# Patient Record
Sex: Female | Born: 1970 | Race: White | Hispanic: No | Marital: Married | Smoking: Former smoker
Health system: Southern US, Community
[De-identification: ages and names within clinical notes are randomized; demographics above are authoritative.]

## PROBLEM LIST (undated history)

## (undated) DIAGNOSIS — I10 Essential (primary) hypertension: Secondary | ICD-10-CM

## (undated) DIAGNOSIS — I880 Nonspecific mesenteric lymphadenitis: Secondary | ICD-10-CM

## (undated) DIAGNOSIS — E1143 Type 2 diabetes mellitus with diabetic autonomic (poly)neuropathy: Secondary | ICD-10-CM

## (undated) DIAGNOSIS — D649 Anemia, unspecified: Secondary | ICD-10-CM

## (undated) DIAGNOSIS — E785 Hyperlipidemia, unspecified: Secondary | ICD-10-CM

## (undated) DIAGNOSIS — R000 Tachycardia, unspecified: Secondary | ICD-10-CM

## (undated) DIAGNOSIS — C539 Malignant neoplasm of cervix uteri, unspecified: Secondary | ICD-10-CM

## (undated) DIAGNOSIS — R079 Chest pain, unspecified: Secondary | ICD-10-CM

## (undated) DIAGNOSIS — D509 Iron deficiency anemia, unspecified: Secondary | ICD-10-CM

## (undated) DIAGNOSIS — K3184 Gastroparesis: Secondary | ICD-10-CM

## (undated) DIAGNOSIS — F419 Anxiety disorder, unspecified: Secondary | ICD-10-CM

## (undated) DIAGNOSIS — E119 Type 2 diabetes mellitus without complications: Secondary | ICD-10-CM

## (undated) HISTORY — DX: Iron deficiency anemia, unspecified: D50.9

## (undated) HISTORY — PX: HERNIA REPAIR: SHX51

## (undated) HISTORY — DX: Tachycardia, unspecified: R00.0

## (undated) HISTORY — DX: Hyperlipidemia, unspecified: E78.5

## (undated) HISTORY — DX: Anxiety disorder, unspecified: F41.9

## (undated) HISTORY — PX: PARTIAL HYSTERECTOMY: SHX80

## (undated) HISTORY — PX: TUBAL LIGATION: SHX77

## (undated) HISTORY — DX: Essential (primary) hypertension: I10

## (undated) HISTORY — DX: Type 2 diabetes mellitus without complications: E11.9

## (undated) HISTORY — DX: Morbid (severe) obesity due to excess calories: E66.01

## (undated) HISTORY — DX: Chest pain, unspecified: R07.9

---

## 2011-12-07 DIAGNOSIS — R079 Chest pain, unspecified: Secondary | ICD-10-CM

## 2012-01-05 DIAGNOSIS — R079 Chest pain, unspecified: Secondary | ICD-10-CM

## 2012-01-06 DIAGNOSIS — R000 Tachycardia, unspecified: Secondary | ICD-10-CM

## 2012-01-06 DIAGNOSIS — R079 Chest pain, unspecified: Secondary | ICD-10-CM

## 2012-01-07 DIAGNOSIS — R072 Precordial pain: Secondary | ICD-10-CM

## 2012-01-08 DIAGNOSIS — R079 Chest pain, unspecified: Secondary | ICD-10-CM

## 2012-05-12 ENCOUNTER — Encounter: Payer: Self-pay | Admitting: Physician Assistant

## 2012-05-13 ENCOUNTER — Encounter: Payer: Self-pay | Admitting: Physician Assistant

## 2012-05-13 DIAGNOSIS — R079 Chest pain, unspecified: Secondary | ICD-10-CM

## 2012-05-13 LAB — PULMONARY FUNCTION TEST

## 2012-05-14 ENCOUNTER — Encounter: Payer: Self-pay | Admitting: Cardiology

## 2012-05-28 ENCOUNTER — Encounter: Payer: BC Managed Care – PPO | Admitting: Physician Assistant

## 2012-06-10 ENCOUNTER — Encounter: Payer: BC Managed Care – PPO | Admitting: Physician Assistant

## 2012-11-07 DIAGNOSIS — R079 Chest pain, unspecified: Secondary | ICD-10-CM

## 2014-07-11 ENCOUNTER — Encounter (HOSPITAL_COMMUNITY): Payer: Self-pay | Admitting: Emergency Medicine

## 2014-07-11 ENCOUNTER — Emergency Department (HOSPITAL_COMMUNITY): Payer: BLUE CROSS/BLUE SHIELD

## 2014-07-11 ENCOUNTER — Inpatient Hospital Stay (HOSPITAL_COMMUNITY)
Admission: EM | Admit: 2014-07-11 | Discharge: 2014-07-14 | DRG: 392 | Disposition: A | Discharge status: Home / Self Care | Payer: BLUE CROSS/BLUE SHIELD | Attending: Internal Medicine | Admitting: Internal Medicine

## 2014-07-11 DIAGNOSIS — K219 Gastro-esophageal reflux disease without esophagitis: Secondary | ICD-10-CM | POA: Diagnosis present

## 2014-07-11 DIAGNOSIS — K529 Noninfective gastroenteritis and colitis, unspecified: Secondary | ICD-10-CM | POA: Diagnosis not present

## 2014-07-11 DIAGNOSIS — D649 Anemia, unspecified: Secondary | ICD-10-CM | POA: Diagnosis present

## 2014-07-11 DIAGNOSIS — K3184 Gastroparesis: Secondary | ICD-10-CM | POA: Diagnosis present

## 2014-07-11 DIAGNOSIS — D72829 Elevated white blood cell count, unspecified: Secondary | ICD-10-CM

## 2014-07-11 DIAGNOSIS — R079 Chest pain, unspecified: Secondary | ICD-10-CM

## 2014-07-11 DIAGNOSIS — R000 Tachycardia, unspecified: Secondary | ICD-10-CM | POA: Diagnosis present

## 2014-07-11 DIAGNOSIS — R112 Nausea with vomiting, unspecified: Secondary | ICD-10-CM | POA: Diagnosis not present

## 2014-07-11 DIAGNOSIS — E118 Type 2 diabetes mellitus with unspecified complications: Secondary | ICD-10-CM

## 2014-07-11 DIAGNOSIS — K589 Irritable bowel syndrome without diarrhea: Secondary | ICD-10-CM | POA: Diagnosis present

## 2014-07-11 DIAGNOSIS — E785 Hyperlipidemia, unspecified: Secondary | ICD-10-CM | POA: Diagnosis present

## 2014-07-11 DIAGNOSIS — K210 Gastro-esophageal reflux disease with esophagitis: Secondary | ICD-10-CM

## 2014-07-11 DIAGNOSIS — Z8541 Personal history of malignant neoplasm of cervix uteri: Secondary | ICD-10-CM

## 2014-07-11 DIAGNOSIS — R109 Unspecified abdominal pain: Secondary | ICD-10-CM | POA: Diagnosis present

## 2014-07-11 DIAGNOSIS — I10 Essential (primary) hypertension: Secondary | ICD-10-CM | POA: Diagnosis present

## 2014-07-11 DIAGNOSIS — I471 Supraventricular tachycardia: Secondary | ICD-10-CM

## 2014-07-11 DIAGNOSIS — Z794 Long term (current) use of insulin: Secondary | ICD-10-CM

## 2014-07-11 DIAGNOSIS — R197 Diarrhea, unspecified: Secondary | ICD-10-CM | POA: Diagnosis present

## 2014-07-11 DIAGNOSIS — E1143 Type 2 diabetes mellitus with diabetic autonomic (poly)neuropathy: Secondary | ICD-10-CM | POA: Diagnosis present

## 2014-07-11 DIAGNOSIS — R1110 Vomiting, unspecified: Secondary | ICD-10-CM

## 2014-07-11 DIAGNOSIS — Z87891 Personal history of nicotine dependence: Secondary | ICD-10-CM

## 2014-07-11 DIAGNOSIS — E861 Hypovolemia: Secondary | ICD-10-CM | POA: Diagnosis present

## 2014-07-11 DIAGNOSIS — D259 Leiomyoma of uterus, unspecified: Secondary | ICD-10-CM | POA: Diagnosis present

## 2014-07-11 DIAGNOSIS — I252 Old myocardial infarction: Secondary | ICD-10-CM

## 2014-07-11 DIAGNOSIS — I880 Nonspecific mesenteric lymphadenitis: Secondary | ICD-10-CM | POA: Diagnosis present

## 2014-07-11 DIAGNOSIS — F419 Anxiety disorder, unspecified: Secondary | ICD-10-CM | POA: Diagnosis present

## 2014-07-11 HISTORY — DX: Malignant neoplasm of cervix uteri, unspecified: C53.9

## 2014-07-11 HISTORY — DX: Nonspecific mesenteric lymphadenitis: I88.0

## 2014-07-11 HISTORY — DX: Anemia, unspecified: D64.9

## 2014-07-11 HISTORY — DX: Gastroparesis: K31.84

## 2014-07-11 HISTORY — DX: Type 2 diabetes mellitus with diabetic autonomic (poly)neuropathy: E11.43

## 2014-07-11 LAB — LIPASE, BLOOD: Lipase: 19 U/L (ref 11–59)

## 2014-07-11 LAB — CBC WITH DIFFERENTIAL/PLATELET
Basophils Absolute: 0 10*3/uL (ref 0.0–0.1)
Basophils Relative: 0 % (ref 0–1)
Eosinophils Absolute: 0.4 10*3/uL (ref 0.0–0.7)
Eosinophils Relative: 3 % (ref 0–5)
HCT: 37.5 % (ref 36.0–46.0)
Hemoglobin: 11.6 g/dL — ABNORMAL LOW (ref 12.0–15.0)
LYMPHS ABS: 2.7 10*3/uL (ref 0.7–4.0)
LYMPHS PCT: 19 % (ref 12–46)
MCH: 25.3 pg — AB (ref 26.0–34.0)
MCHC: 30.9 g/dL (ref 30.0–36.0)
MCV: 81.7 fL (ref 78.0–100.0)
MONOS PCT: 4 % (ref 3–12)
Monocytes Absolute: 0.6 10*3/uL (ref 0.1–1.0)
NEUTROS PCT: 74 % (ref 43–77)
Neutro Abs: 10.4 10*3/uL — ABNORMAL HIGH (ref 1.7–7.7)
PLATELETS: 479 10*3/uL — AB (ref 150–400)
RBC: 4.59 MIL/uL (ref 3.87–5.11)
RDW: 14.1 % (ref 11.5–15.5)
WBC: 14.2 10*3/uL — ABNORMAL HIGH (ref 4.0–10.5)

## 2014-07-11 LAB — BASIC METABOLIC PANEL
ANION GAP: 9 (ref 5–15)
BUN: 14 mg/dL (ref 6–23)
CALCIUM: 9.7 mg/dL (ref 8.4–10.5)
CO2: 25 mmol/L (ref 19–32)
Chloride: 102 mmol/L (ref 96–112)
Creatinine, Ser: 0.78 mg/dL (ref 0.50–1.10)
GFR calc Af Amer: >90 mL/min (ref >90)
GFR calc non Af Amer: >90 mL/min (ref >90)
Glucose, Bld: 256 mg/dL — ABNORMAL HIGH (ref 70–99)
Potassium: 4 mmol/L (ref 3.5–5.1)
SODIUM: 136 mmol/L (ref 135–145)

## 2014-07-11 LAB — TROPONIN I: Troponin I: <0.03 ng/mL (ref <0.031)

## 2014-07-11 LAB — HEPATIC FUNCTION PANEL
ALT: 20 U/L (ref 0–35)
AST: 19 U/L (ref 0–37)
Albumin: 4.3 g/dL (ref 3.5–5.2)
Alkaline Phosphatase: 106 U/L (ref 39–117)
BILIRUBIN TOTAL: 0.7 mg/dL (ref 0.3–1.2)
Total Protein: 8.4 g/dL — ABNORMAL HIGH (ref 6.0–8.3)

## 2014-07-11 MED ORDER — METOPROLOL TARTRATE 1 MG/ML IV SOLN
2.5000 mg | Freq: Once | INTRAVENOUS | Status: AC
  Administered 2014-07-11: 2.5 mg via INTRAVENOUS
  Filled 2014-07-11: qty 5

## 2014-07-11 MED ORDER — PROMETHAZINE HCL 25 MG/ML IJ SOLN: 25.0000 mg | Freq: Four times a day (QID) | INTRAMUSCULAR | Status: DC | PRN

## 2014-07-11 MED ORDER — IOHEXOL 350 MG/ML SOLN
100.0000 mL | Freq: Once | INTRAVENOUS | Status: AC | PRN
  Administered 2014-07-11: 100 mL via INTRAVENOUS

## 2014-07-11 MED ORDER — SODIUM CHLORIDE 0.9 % IV BOLUS (SEPSIS): 1000.0000 mL | Freq: Once | INTRAVENOUS | Status: AC

## 2014-07-11 MED ORDER — ENOXAPARIN SODIUM 40 MG/0.4ML ~~LOC~~ SOLN
40.0000 mg | SUBCUTANEOUS | Status: DC
  Administered 2014-07-11: 40 mg via SUBCUTANEOUS
  Filled 2014-07-11: qty 0.4

## 2014-07-11 MED ORDER — MORPHINE SULFATE 4 MG/ML IJ SOLN
4.0000 mg | INTRAMUSCULAR | Status: DC | PRN
  Administered 2014-07-11 – 2014-07-14 (×15): 4 mg via INTRAVENOUS
  Filled 2014-07-11 (×15): qty 1

## 2014-07-11 MED ORDER — ONDANSETRON HCL 4 MG PO TABS: 4.0000 mg | ORAL_TABLET | Freq: Four times a day (QID) | ORAL | Status: DC | PRN

## 2014-07-11 MED ORDER — HYDRALAZINE HCL 20 MG/ML IJ SOLN: 5.0000 mg | INTRAMUSCULAR | Status: DC | PRN

## 2014-07-11 MED ORDER — ONDANSETRON HCL 4 MG/2ML IJ SOLN
4.0000 mg | Freq: Once | INTRAMUSCULAR | Status: AC
  Administered 2014-07-11: 4 mg via INTRAMUSCULAR

## 2014-07-11 MED ORDER — SODIUM CHLORIDE 0.9 % IV SOLN
INTRAVENOUS | Status: DC
  Administered 2014-07-11: 21:00:00 via INTRAVENOUS

## 2014-07-11 MED ORDER — METOPROLOL TARTRATE 50 MG PO TABS
100.0000 mg | ORAL_TABLET | Freq: Two times a day (BID) | ORAL | Status: DC
  Administered 2014-07-11 – 2014-07-14 (×6): 100 mg via ORAL
  Filled 2014-07-11 (×6): qty 2

## 2014-07-11 MED ORDER — ONDANSETRON HCL 4 MG/2ML IJ SOLN
INTRAMUSCULAR | Status: AC
  Filled 2014-07-11: qty 2

## 2014-07-11 MED ORDER — METOCLOPRAMIDE HCL 10 MG PO TABS
10.0000 mg | ORAL_TABLET | Freq: Four times a day (QID) | ORAL | Status: DC
  Administered 2014-07-11 – 2014-07-12 (×2): 10 mg via ORAL
  Filled 2014-07-11 (×2): qty 1

## 2014-07-11 MED ORDER — MORPHINE SULFATE 4 MG/ML IJ SOLN
4.0000 mg | INTRAMUSCULAR | Status: AC | PRN
  Administered 2014-07-11 (×2): 4 mg via INTRAVENOUS
  Filled 2014-07-11 (×2): qty 1

## 2014-07-11 MED ORDER — AMLODIPINE BESYLATE 5 MG PO TABS
10.0000 mg | ORAL_TABLET | Freq: Every day | ORAL | Status: DC
  Administered 2014-07-12 – 2014-07-14 (×3): 10 mg via ORAL
  Filled 2014-07-11 (×3): qty 2

## 2014-07-11 MED ORDER — SODIUM CHLORIDE 0.9 % IV BOLUS (SEPSIS)
500.0000 mL | Freq: Once | INTRAVENOUS | Status: AC
  Administered 2014-07-11: 500 mL via INTRAVENOUS

## 2014-07-11 MED ORDER — FENTANYL CITRATE 0.05 MG/ML IJ SOLN
INTRAMUSCULAR | Status: AC
  Filled 2014-07-11: qty 2

## 2014-07-11 MED ORDER — CILIDINIUM-CHLORDIAZEPOXIDE 2.5-5 MG PO CAPS
1.0000 | ORAL_CAPSULE | Freq: Three times a day (TID) | ORAL | Status: DC
Start: 2014-07-12 — End: 2014-07-12

## 2014-07-11 MED ORDER — GI COCKTAIL ~~LOC~~
30.0000 mL | Freq: Once | ORAL | Status: AC
  Administered 2014-07-11: 30 mL via ORAL
  Filled 2014-07-11: qty 30

## 2014-07-11 MED ORDER — FAMOTIDINE IN NACL 20-0.9 MG/50ML-% IV SOLN
20.0000 mg | Freq: Once | INTRAVENOUS | Status: AC
  Administered 2014-07-11: 20 mg via INTRAVENOUS
  Filled 2014-07-11: qty 50

## 2014-07-11 MED ORDER — ONDANSETRON HCL 4 MG/2ML IJ SOLN
4.0000 mg | Freq: Four times a day (QID) | INTRAMUSCULAR | Status: DC | PRN
  Administered 2014-07-12: 4 mg via INTRAVENOUS
  Filled 2014-07-11: qty 2

## 2014-07-11 MED ORDER — SODIUM CHLORIDE 0.9 % IJ SOLN
3.0000 mL | Freq: Two times a day (BID) | INTRAMUSCULAR | Status: DC
  Administered 2014-07-11 – 2014-07-13 (×4): 3 mL via INTRAVENOUS

## 2014-07-11 MED ORDER — SODIUM CHLORIDE 0.9 % IV SOLN
INTRAVENOUS | Status: DC
  Administered 2014-07-11: via INTRAVENOUS

## 2014-07-11 MED ORDER — PROMETHAZINE HCL 25 MG/ML IJ SOLN
12.5000 mg | Freq: Once | INTRAMUSCULAR | Status: AC
  Administered 2014-07-11: 12.5 mg via INTRAVENOUS
  Filled 2014-07-11: qty 1

## 2014-07-11 MED ORDER — FENTANYL CITRATE 0.05 MG/ML IJ SOLN
50.0000 ug | Freq: Once | INTRAMUSCULAR | Status: AC
  Administered 2014-07-11: 50 ug via INTRAVENOUS

## 2014-07-11 MED ORDER — INSULIN ASPART 100 UNIT/ML ~~LOC~~ SOLN
0.0000 [IU] | Freq: Three times a day (TID) | SUBCUTANEOUS | Status: DC
  Administered 2014-07-12: 3 [IU] via SUBCUTANEOUS
  Administered 2014-07-12: 5 [IU] via SUBCUTANEOUS
  Administered 2014-07-12: 2 [IU] via SUBCUTANEOUS
  Administered 2014-07-13: 3 [IU] via SUBCUTANEOUS
  Administered 2014-07-13: 2 [IU] via SUBCUTANEOUS

## 2014-07-11 MED ORDER — GI COCKTAIL ~~LOC~~: 30.0000 mL | Freq: Three times a day (TID) | ORAL | Status: DC | PRN

## 2014-07-11 NOTE — ED Notes (Signed)
Patient c/o upper abd pain that radiates into chest and left shoulder. Per patient abd pain started Sunday and pain radiating into chest started yesterday. Patient reports nausea, vomiting, and diarrhea. Denies any fevers or urinary symptoms.

## 2014-07-11 NOTE — H&P (Signed)
[facility] History and Physical  Monica Fernandez YTK:[id number] DOB: 1970-05-05 DOA: 07/11/2014  Referring physician: Dr Thurnell Garbe - [facility] PCP: Sudie Grumbling, DO   Chief Complaint: Nausea and vomiting  HPI: Monica Fernandez is a 44 y.o. female  Butch Penny pain associated with nausea and vomiting started 3 days ago. 4-5x emesis per day. Emesis is non-bloody. Unable to keep anything down. Associated with watery nonbloody diarrhea, upper epigastric abdominal pain with radiation to the upper chest and shoulder. Pain was initially intermittent but is now becoming constant. Pain is described as crampy and achy in nature. Patient has not been able to take her home prescriptions since Sunday. No alleviating factors. Getting worse.   Denies sick contacts  Review of Systems:  Constitutional:  No weight loss, night sweats, Fevers, chills, fatigue.  HEENT:  No headaches, Difficulty swallowing,Tooth/dental problems,Sore throat,  No sneezing, itching, ear ache, nasal congestion, post nasal drip,  Cardio-vascular: Per HPI GI: Per HPI Resp:   No shortness of breath with exertion or at rest. No excess mucus, no productive cough, No non-productive cough, No coughing up of blood.No change in color of mucus.No wheezing.No chest wall deformity  Skin:  no rash or lesions.  GU:  no dysuria, change in color of urine, no urgency or frequency. No flank pain.  Musculoskeletal:   No joint pain or swelling. No decreased range of motion. No back pain.  Psych:  No change in mood or affect. No depression or anxiety. No memory loss.   Past Medical History  Diagnosis Date  . Chest pain syndrome   . Sinus tachycardia     Persistent  . Microcytic anemia   . DM (diabetes mellitus)   . HTN (hypertension)   . HLD (hyperlipidemia)   . Morbid obesity   . Anxiety   . Cervical cancer    Past Surgical History  Procedure Laterality Date  . Hernia repair    . Partial hysterectomy    . Tubal ligation     Social  History:  reports that she has quit smoking. Her smoking use included Cigarettes. She has a .5 pack-year smoking history. She has never used smokeless tobacco. She reports that she drinks alcohol. She reports that she does not use illicit drugs.  No Known Allergies  Family History  Problem Relation Age of Onset  . Heart attack Father   . Heart attack Brother 36     Prior to Admission medications   Medication Sig Start Date End Date Taking? Authorizing Provider  amLODipine (NORVASC) 10 MG tablet Take 10 mg by mouth daily.    Historical Provider, MD  clidinium-chlordiazePOXIDE (LIBRAX) 2.5-5 MG per capsule Take 1 capsule by mouth 4 (four) times daily - after meals and at bedtime.    Historical Provider, MD  metoCLOPramide (REGLAN) 10 MG tablet Take 10 mg by mouth 4 (four) times daily.    Historical Provider, MD  metoprolol (LOPRESSOR) 100 MG tablet Take 100 mg by mouth 2 (two) times daily.    Historical Provider, MD  pantoprazole (PROTONIX) 40 MG tablet Take 40 mg by mouth daily.    Historical Provider, MD   Physical Exam: Filed Vitals:   07/11/14 1930 07/11/14 2030 07/11/14 2100 07/11/14 2130  BP: 136/103 112/92 131/92 142/81  Pulse: 120 101  109  Temp:      TempSrc:      Resp: 17 20  15   Height:      Weight:      SpO2: 98% 95%  97%  Wt Readings from Last 3 Encounters:  07/11/14 141.522 kg (312 lb)    General:  Appears calm and comfortable Eyes:  PERRL, normal lids, irises & conjunctiva ENT: Dry mucous membranes Neck:  no LAD, masses or thyromegaly Cardiovascular:  RRR, no m/r/g. Trace to 1+ lower extremity edema bilaterally Telemetry:  SR, no arrhythmias  Respiratory:  CTA bilaterally, no w/r/r. Normal respiratory effort. Abdomen: Morbidly obese, diffuse mild tenderness to palpation intermittently. Nondistended. Normal active bowel sounds Skin:  no rash or induration seen on limited exam Musculoskeletal:  grossly normal tone BUE/BLE Psychiatric:  grossly normal mood and  affect, speech fluent and appropriate Neurologic:  grossly non-focal.          Labs on Admission:  Basic Metabolic Panel:  Recent Labs Lab 07/11/14 1845  NA 136  K 4.0  CL 102  CO2 25  GLUCOSE 256*  BUN 14  CREATININE 0.78  CALCIUM 9.7   Liver Function Tests:  Recent Labs Lab 07/11/14 1850  AST 19  ALT 20  ALKPHOS 106  BILITOT 0.7  PROT 8.4*  ALBUMIN 4.3    Recent Labs Lab 07/11/14 1850  LIPASE 19   No results for input(s): AMMONIA in the last 168 hours. CBC:  Recent Labs Lab 07/11/14 1845  WBC 14.2*  NEUTROABS 10.4*  HGB 11.6*  HCT 37.5  MCV 81.7  PLT 479*   Cardiac Enzymes:  Recent Labs Lab 07/11/14 1850  TROPONINI <0.03    BNP (last 3 results) No results for input(s): BNP in the last 8760 hours.  ProBNP (last 3 results) No results for input(s): PROBNP in the last 8760 hours.  CBG: No results for input(s): GLUCAP in the last 168 hours.  Radiological Exams on Admission: Ct Angio Chest Pe W/cm &/or Wo Cm  07/11/2014   CLINICAL DATA:  Upper abdominal pain radiating in chest and left shoulder. Abdominal pain started 2 days prior, chest radiation 1 day prior. Nausea, vomiting, and diarrhea.  EXAM: CT ANGIOGRAPHY CHEST  CT ABDOMEN AND PELVIS WITH CONTRAST  TECHNIQUE: Multidetector CT imaging of the chest was performed using the standard protocol during bolus administration of intravenous contrast. Multiplanar CT image reconstructions and MIPs were obtained to evaluate the vascular anatomy. Multidetector CT imaging of the abdomen and pelvis was performed using the standard protocol during bolus administration of intravenous contrast.  CONTRAST:  169mL OMNIPAQUE IOHEXOL 350 MG/ML SOLN  COMPARISON:  Radiographs earlier this date. CT abdomen/ pelvis 02/21/2014  FINDINGS: CTA CHEST FINDINGS  There are no filling defects within the pulmonary arteries to suggest pulmonary embolus.  The thoracic aorta is normal in caliber without dissection. Heart at the  upper limits of normal in size. No pleural or pericardial effusion. No mediastinal or hilar adenopathy. Mild hypoventilatory changes at the lung bases, the lungs are otherwise clear. No pulmonary nodule or mass. No consolidation.  There are no acute or suspicious osseous abnormalities.  CT ABDOMEN and PELVIS FINDINGS  Liver is enlarged measuring 22.4 cm in cranial caudal dimension with mild hepatic steatosis. Gallbladder is physiologically distended. No biliary dilatation. Spleen is at the upper limits of normal in size measuring 12 cm. The pancreas and adrenal glands are normal. Kidneys demonstrate symmetric enhancement and excretion. No hydronephrosis. There is an 11 mm hypodensity in the upper left kidney.  Small hiatal hernia. Stomach is decompressed. There are no dilated or thickened bowel loops. The appendix is normal. Small volume of colonic stool. Borderline prominent mesenteric lymph nodes in the ileocolic chain. No  free air, free fluid, or intra-abdominal fluid collection.  Abdominal aorta is normal in caliber. No retroperitoneal adenopathy. Small fat containing umbilical hernia.  Within the pelvis the urinary bladder is minimally distended. Uterus is surgically absent. Both ovaries are tentatively identified and normal, no adnexal mass. There is no pelvic free fluid. No pelvic adenopathy.  There are no acute or suspicious osseous abnormalities. Small bone island in the right iliac bone, unchanged. Mild degenerative change in the lumbar spine.  Review of the MIP images confirms the above findings.  IMPRESSION: 1. No pulmonary embolus or acute intrathoracic process. 2. Borderline prominent mesenteric lymph nodes in the ileocolic chain, may reflect mild mesenteric adenitis. 3. There is otherwise no acute abnormality in the abdomen/pelvis. Small left renal hypodensity, likely cyst, and fat containing periumbilical hernia, unchanged.   Electronically Signed   By: Jeb Levering M.D.   On: 07/11/2014 20:55    Ct Abdomen Pelvis W Contrast  07/11/2014   CLINICAL DATA:  Upper abdominal pain radiating in chest and left shoulder. Abdominal pain started 2 days prior, chest radiation 1 day prior. Nausea, vomiting, and diarrhea.  EXAM: CT ANGIOGRAPHY CHEST  CT ABDOMEN AND PELVIS WITH CONTRAST  TECHNIQUE: Multidetector CT imaging of the chest was performed using the standard protocol during bolus administration of intravenous contrast. Multiplanar CT image reconstructions and MIPs were obtained to evaluate the vascular anatomy. Multidetector CT imaging of the abdomen and pelvis was performed using the standard protocol during bolus administration of intravenous contrast.  CONTRAST:  150mL OMNIPAQUE IOHEXOL 350 MG/ML SOLN  COMPARISON:  Radiographs earlier this date. CT abdomen/ pelvis 02/21/2014  FINDINGS: CTA CHEST FINDINGS  There are no filling defects within the pulmonary arteries to suggest pulmonary embolus.  The thoracic aorta is normal in caliber without dissection. Heart at the upper limits of normal in size. No pleural or pericardial effusion. No mediastinal or hilar adenopathy. Mild hypoventilatory changes at the lung bases, the lungs are otherwise clear. No pulmonary nodule or mass. No consolidation.  There are no acute or suspicious osseous abnormalities.  CT ABDOMEN and PELVIS FINDINGS  Liver is enlarged measuring 22.4 cm in cranial caudal dimension with mild hepatic steatosis. Gallbladder is physiologically distended. No biliary dilatation. Spleen is at the upper limits of normal in size measuring 12 cm. The pancreas and adrenal glands are normal. Kidneys demonstrate symmetric enhancement and excretion. No hydronephrosis. There is an 11 mm hypodensity in the upper left kidney.  Small hiatal hernia. Stomach is decompressed. There are no dilated or thickened bowel loops. The appendix is normal. Small volume of colonic stool. Borderline prominent mesenteric lymph nodes in the ileocolic chain. No free air, free  fluid, or intra-abdominal fluid collection.  Abdominal aorta is normal in caliber. No retroperitoneal adenopathy. Small fat containing umbilical hernia.  Within the pelvis the urinary bladder is minimally distended. Uterus is surgically absent. Both ovaries are tentatively identified and normal, no adnexal mass. There is no pelvic free fluid. No pelvic adenopathy.  There are no acute or suspicious osseous abnormalities. Small bone island in the right iliac bone, unchanged. Mild degenerative change in the lumbar spine.  Review of the MIP images confirms the above findings.  IMPRESSION: 1. No pulmonary embolus or acute intrathoracic process. 2. Borderline prominent mesenteric lymph nodes in the ileocolic chain, may reflect mild mesenteric adenitis. 3. There is otherwise no acute abnormality in the abdomen/pelvis. Small left renal hypodensity, likely cyst, and fat containing periumbilical hernia, unchanged.   Electronically Signed  By: Jeb Levering M.D.   On: 07/11/2014 20:55   Dg Abd Acute W/chest  07/11/2014   CLINICAL DATA:  Abdominal pain, nausea, vomiting, diarrhea  EXAM: ACUTE ABDOMEN SERIES (ABDOMEN 2 VIEW & CHEST 1 VIEW)  COMPARISON:  None.  FINDINGS: There is no evidence of dilated bowel loops or free intraperitoneal air. No radiopaque calculi or other significant radiographic abnormality is seen. Heart size and mediastinal contours are within normal limits. Both lungs are clear. Moderate stool noted in right colon.  IMPRESSION: Normal small bowel gas pattern. Moderate stool noted in right colon. No acute cardiopulmonary disease.   Electronically Signed   By: Lahoma Crocker M.D.   On: 07/11/2014 19:48    EKG: Independently reviewed. SInus Tach, no sign of ACS  Assessment/Plan Principal Problem:   Intractable nausea and vomiting Active Problems:   Sinus tachycardia   Chest pain   IBS (irritable bowel syndrome)   GERD (gastroesophageal reflux disease)   Leukocytosis   Essential hypertension    Diabetes mellitus with complication   Intractable nausea and vomiting: Likely secondary to diabetic Esther paresis with possible overlying viral gastroenteritis. Abdominal CT without acute process. Pepcid, fentanyl, GI cocktail, morphine, Zofran 4 mg, Phenergan 12.5 mg, 500 mL normal saline bolus with some improvement. Unable to keep down her Reglan for several days. Lipase normal. - zofran, PHenergan, Reglan - Normal saline 125 mL per hour - Normal saline 1 L bolus - Clear liquid diet, advance as tolerated  Chest pain: Likely secondary to GI etiology. Troponin negative, EKG without evidence of ACS. CT angiogram without evidence of PE, or other acute respiratory process - Cycle troponins - Treatment as above - EKG in a.m. - Telemetry  HTN: Hypertensive on presentation. In pain. Unable to tolerate medicines for several days. - Continue home Norvasc, metoprolol - Hydralazine when necessary SBP> 180  Leukocytosis: WBC 14.2. Likely secondary to several day history of acute distress and hemoconcentration - Follow-up CBC in a.m. - UA  SInus Tach: Patient should a slightly tachycardic. Unable to take metoprolol for several days. Currently in pain. EKG reassuring. - Resume beta blocker  GERD: - Continue Protonix  (Change to IV)  Diabetes: On Januvia only - SSI - A1c  IBS: - Continue Librax  Code Status: FULL DVT Prophylaxis: Lovenox Family Communication: None Disposition Plan: pending improvement  MERRELL, DAVID Lenna Sciara, MD Family Medicine [facility] [email or web address] Password [email or web address]

## 2014-07-11 NOTE — ED Provider Notes (Addendum)
CSN: [id number]     Arrival date & time 07/11/14  1754 History   First MD Initiated Contact with Patient 07/11/14 1846     Chief Complaint  Patient presents with  . Abdominal Pain  . Chest Pain  . Emesis  . Diarrhea      HPI Pt was seen at 1900. Per pt, c/o gradual onset and persistence of multiple intermittent episodes of N/V/D that began 3 days ago.   Describes the stools as "watery" and "dark." Has been associated with upper abd "pain" which radiates into her chest and shoulder that began yesterday and remains constant. Describes the pain as "cramping" and "aching." Pt states she has been unable to take any of her usual home medications due to her symptoms. Denies SOB, no back pain, no fevers, no blood in stools or emesis.     Past Medical History  Diagnosis Date  . Chest pain syndrome   . Sinus tachycardia     Persistent  . Microcytic anemia   . DM (diabetes mellitus)   . HTN (hypertension)   . HLD (hyperlipidemia)   . Morbid obesity   . Anxiety   . Cervical cancer    Past Surgical History  Procedure Laterality Date  . Hernia repair    . Partial hysterectomy    . Tubal ligation     Family History  Problem Relation Age of Onset  . Heart attack Father    History  Substance Use Topics  . Smoking status: Former Smoker -- 0.25 packs/day for 2 years    Types: Cigarettes  . Smokeless tobacco: Never Used  . Alcohol Use: Yes     Comment: rarely   OB History    Gravida Para Term Preterm AB TAB SAB Ectopic Multiple Living   3 3 3       3      Review of Systems ROS: Statement: All systems negative except as marked or noted in the HPI; Constitutional: Negative for fever and chills. ; ; Eyes: Negative for eye pain, redness and discharge. ; ; ENMT: Negative for ear pain, hoarseness, nasal congestion, sinus pressure and sore throat. ; ; Cardiovascular: +CP. Negative for palpitations, diaphoresis, dyspnea and peripheral edema. ; ; Respiratory: Negative for cough, wheezing and  stridor. ; ; Gastrointestinal: +N/V/D, abd pain. Negative for blood in stool, hematemesis, jaundice and rectal bleeding. . ; ; Genitourinary: Negative for dysuria, flank pain and hematuria. ; ; Musculoskeletal: Negative for back pain and neck pain. Negative for swelling and trauma.; ; Skin: Negative for pruritus, rash, abrasions, blisters, bruising and skin lesion.; ; Neuro: Negative for headache, lightheadedness and neck stiffness. Negative for weakness, altered level of consciousness , altered mental status, extremity weakness, paresthesias, involuntary movement, seizure and syncope.      Allergies  Metronidazole  Home Medications   Prior to Admission medications   Medication Sig Start Date End Date Taking? Authorizing Provider  amLODipine (NORVASC) 10 MG tablet Take 10 mg by mouth daily.    Historical Provider, MD  clidinium-chlordiazePOXIDE (LIBRAX) 2.5-5 MG per capsule Take 1 capsule by mouth 4 (four) times daily - after meals and at bedtime.    Historical Provider, MD  HYDROcodone-acetaminophen (LORTAB) 10-500 MG per tablet Take 1 tablet by mouth every 6 (six) hours as needed.    Historical Provider, MD  LORazepam (ATIVAN) 1 MG tablet Take 1 mg by mouth every 8 (eight) hours.    Historical Provider, MD  metFORMIN (GLUCOPHAGE) 500 MG tablet Take 500 mg  by mouth 2 (two) times daily with a meal.    Historical Provider, MD  metoCLOPramide (REGLAN) 10 MG tablet Take 10 mg by mouth 4 (four) times daily.    Historical Provider, MD  metoprolol (LOPRESSOR) 100 MG tablet Take 100 mg by mouth 2 (two) times daily.    Historical Provider, MD  pantoprazole (PROTONIX) 40 MG tablet Take 40 mg by mouth daily.    Historical Provider, MD  temazepam (RESTORIL) 30 MG capsule Take 30 mg by mouth at bedtime as needed.    Historical Provider, MD   BP 136/103 mmHg  Pulse 120  Temp(Src) 100.1 F (37.8 C) (Rectal)  Resp 17  Ht 5\' 5"  (1.651 m)  Wt 312 lb (141.522 kg)  BMI 51.92 kg/m2  SpO2 98% Physical Exam   1905: Physical examination:  Nursing notes reviewed; Vital signs and O2 SAT reviewed;  Constitutional: Well developed, Well nourished, Well hydrated, Uncomfortable appearing; Head:  Normocephalic, atraumatic; Eyes: EOMI, PERRL, No scleral icterus; ENMT: Mouth and pharynx normal, Mucous membranes moist; Neck: Supple, Full range of motion, No lymphadenopathy; Cardiovascular: Tachycardic rate and rhythm, No gallop; Respiratory: Breath sounds clear & equal bilaterally, No wheezes.  Speaking full sentences with ease, Normal respiratory effort/excursion; Chest: Nontender, Movement normal; Abdomen: Soft, +mild diffuse tenderness to palp. No rebound or guarding. Nondistended, Normal bowel sounds. Rectal exam performed w/permission of pt and ED RN chaperone present.  Anal tone normal.  Non-tender, soft brown stool in rectal vault, heme neg.  No fissures, no external hemorrhoids, no palp masses.; Genitourinary: No CVA tenderness; Extremities: Pulses normal, No tenderness, No edema, No calf edema or asymmetry.; Neuro: AA&Ox3, Major CN grossly intact.  Speech clear. No gross focal motor or sensory deficits in extremities.; Skin: Color normal, Warm, Dry.; Psych:  Anxious, intermittently hyperventilating.    ED Course  Procedures   2015:  Pt continues to c/o CP, abd pain and nausea despite medications. Will give a dose of pt's usual metoprolol IV, d/t persistent nausea. Will also check CT-A chest r/o PE as cause for CP, as well as CT A/P.   2145:  Pt continues to have N/V despite meds. CT scans reassuring. HR has improved after pt's metoprolol given by IV.  Doubt PE as cause for symptoms with negative CT-A chest.  Doubt ACS as cause for symptoms with normal troponin and EKG after 2 to 3 days of constant symptoms. Dx and testing d/w pt.  Questions answered.  Verb understanding, agreeable to admit.  T/C to [facility] Dr. Marily Memos, case discussed, including:  HPI, pertinent PM/SHx, VS/PE, dx testing, ED course and treatment:   Agreeable to admit, requests to write temporary orders, obtain observation tele bed to team APAdmits.     EKG Interpretation None      MDM  MDM Reviewed: nursing note and vitals Interpretation: labs, ECG, x-ray and CT scan     ED ECG REPORT   Date: 07/11/2014  Rate: 121  Rhythm: sinus tachycardia  QRS Axis: normal  Intervals: normal  ST/T Wave abnormalities: normal  Conduction Disutrbances:none  Narrative Interpretation:   Old EKG Reviewed: none available.  Results for orders placed or performed during the hospital encounter of 07/11/14  CBC with Differential  Result Value Ref Range   WBC 14.2 (H) 4.0 - 10.5 K/uL   RBC 4.59 3.87 - 5.11 MIL/uL   Hemoglobin 11.6 (L) 12.0 - 15.0 g/dL   HCT 37.5 36.0 - 46.0 %   MCV 81.7 78.0 - 100.0 fL   MCH  25.3 (L) 26.0 - 34.0 pg   MCHC 30.9 30.0 - 36.0 g/dL   RDW 14.1 11.5 - 15.5 %   Platelets 479 (H) 150 - 400 K/uL   Neutrophils Relative % 74 43 - 77 %   Neutro Abs 10.4 (H) 1.7 - 7.7 K/uL   Lymphocytes Relative 19 12 - 46 %   Lymphs Abs 2.7 0.7 - 4.0 K/uL   Monocytes Relative 4 3 - 12 %   Monocytes Absolute 0.6 0.1 - 1.0 K/uL   Eosinophils Relative 3 0 - 5 %   Eosinophils Absolute 0.4 0.0 - 0.7 K/uL   Basophils Relative 0 0 - 1 %   Basophils Absolute 0.0 0.0 - 0.1 K/uL  Basic metabolic panel  Result Value Ref Range   Sodium 136 135 - 145 mmol/L   Potassium 4.0 3.5 - 5.1 mmol/L   Chloride 102 96 - 112 mmol/L   CO2 25 19 - 32 mmol/L   Glucose, Bld 256 (H) 70 - 99 mg/dL   BUN 14 6 - 23 mg/dL   Creatinine, Ser 0.78 0.50 - 1.10 mg/dL   Calcium 9.7 8.4 - 10.5 mg/dL   GFR calc non Af Amer >90 >90 mL/min   GFR calc Af Amer >90 >90 mL/min   Anion gap 9 5 - 15  Lipase, blood  Result Value Ref Range   Lipase 19 11 - 59 U/L  Hepatic function panel  Result Value Ref Range   Total Protein 8.4 (H) 6.0 - 8.3 g/dL   Albumin 4.3 3.5 - 5.2 g/dL   AST 19 0 - 37 U/L   ALT 20 0 - 35 U/L   Alkaline Phosphatase 106 39 - 117 U/L    Total Bilirubin 0.7 0.3 - 1.2 mg/dL   Bilirubin, Direct <0.1 0.0 - 0.5 mg/dL   Indirect Bilirubin NOT CALCULATED 0.3 - 0.9 mg/dL  Troponin I  Result Value Ref Range   Troponin I <0.03 <0.031 ng/mL   Dg Abd Acute W/chest 07/11/2014   CLINICAL DATA:  Abdominal pain, nausea, vomiting, diarrhea  EXAM: ACUTE ABDOMEN SERIES (ABDOMEN 2 VIEW & CHEST 1 VIEW)  COMPARISON:  None.  FINDINGS: There is no evidence of dilated bowel loops or free intraperitoneal air. No radiopaque calculi or other significant radiographic abnormality is seen. Heart size and mediastinal contours are within normal limits. Both lungs are clear. Moderate stool noted in right colon.  IMPRESSION: Normal small bowel gas pattern. Moderate stool noted in right colon. No acute cardiopulmonary disease.   Electronically Signed   By: Lahoma Crocker M.D.   On: 07/11/2014 19:48    Ct Angio Chest Pe W/cm &/or Wo Cm 07/11/2014   CLINICAL DATA:  Upper abdominal pain radiating in chest and left shoulder. Abdominal pain started 2 days prior, chest radiation 1 day prior. Nausea, vomiting, and diarrhea.  EXAM: CT ANGIOGRAPHY CHEST  CT ABDOMEN AND PELVIS WITH CONTRAST  TECHNIQUE: Multidetector CT imaging of the chest was performed using the standard protocol during bolus administration of intravenous contrast. Multiplanar CT image reconstructions and MIPs were obtained to evaluate the vascular anatomy. Multidetector CT imaging of the abdomen and pelvis was performed using the standard protocol during bolus administration of intravenous contrast.  CONTRAST:  122mL OMNIPAQUE IOHEXOL 350 MG/ML SOLN  COMPARISON:  Radiographs earlier this date. CT abdomen/ pelvis 02/21/2014  FINDINGS: CTA CHEST FINDINGS  There are no filling defects within the pulmonary arteries to suggest pulmonary embolus.  The thoracic aorta is normal in caliber  without dissection. Heart at the upper limits of normal in size. No pleural or pericardial effusion. No mediastinal or hilar adenopathy.  Mild hypoventilatory changes at the lung bases, the lungs are otherwise clear. No pulmonary nodule or mass. No consolidation.  There are no acute or suspicious osseous abnormalities.  CT ABDOMEN and PELVIS FINDINGS  Liver is enlarged measuring 22.4 cm in cranial caudal dimension with mild hepatic steatosis. Gallbladder is physiologically distended. No biliary dilatation. Spleen is at the upper limits of normal in size measuring 12 cm. The pancreas and adrenal glands are normal. Kidneys demonstrate symmetric enhancement and excretion. No hydronephrosis. There is an 11 mm hypodensity in the upper left kidney.  Small hiatal hernia. Stomach is decompressed. There are no dilated or thickened bowel loops. The appendix is normal. Small volume of colonic stool. Borderline prominent mesenteric lymph nodes in the ileocolic chain. No free air, free fluid, or intra-abdominal fluid collection.  Abdominal aorta is normal in caliber. No retroperitoneal adenopathy. Small fat containing umbilical hernia.  Within the pelvis the urinary bladder is minimally distended. Uterus is surgically absent. Both ovaries are tentatively identified and normal, no adnexal mass. There is no pelvic free fluid. No pelvic adenopathy.  There are no acute or suspicious osseous abnormalities. Small bone island in the right iliac bone, unchanged. Mild degenerative change in the lumbar spine.  Review of the MIP images confirms the above findings.  IMPRESSION: 1. No pulmonary embolus or acute intrathoracic process. 2. Borderline prominent mesenteric lymph nodes in the ileocolic chain, may reflect mild mesenteric adenitis. 3. There is otherwise no acute abnormality in the abdomen/pelvis. Small left renal hypodensity, likely cyst, and fat containing periumbilical hernia, unchanged.   Electronically Signed   By: Jeb Levering M.D.   On: 07/11/2014 20:55   Ct Abdomen Pelvis W Contrast 07/11/2014   CLINICAL DATA:  Upper abdominal pain radiating in chest and  left shoulder. Abdominal pain started 2 days prior, chest radiation 1 day prior. Nausea, vomiting, and diarrhea.  EXAM: CT ANGIOGRAPHY CHEST  CT ABDOMEN AND PELVIS WITH CONTRAST  TECHNIQUE: Multidetector CT imaging of the chest was performed using the standard protocol during bolus administration of intravenous contrast. Multiplanar CT image reconstructions and MIPs were obtained to evaluate the vascular anatomy. Multidetector CT imaging of the abdomen and pelvis was performed using the standard protocol during bolus administration of intravenous contrast.  CONTRAST:  116mL OMNIPAQUE IOHEXOL 350 MG/ML SOLN  COMPARISON:  Radiographs earlier this date. CT abdomen/ pelvis 02/21/2014  FINDINGS: CTA CHEST FINDINGS  There are no filling defects within the pulmonary arteries to suggest pulmonary embolus.  The thoracic aorta is normal in caliber without dissection. Heart at the upper limits of normal in size. No pleural or pericardial effusion. No mediastinal or hilar adenopathy. Mild hypoventilatory changes at the lung bases, the lungs are otherwise clear. No pulmonary nodule or mass. No consolidation.  There are no acute or suspicious osseous abnormalities.  CT ABDOMEN and PELVIS FINDINGS  Liver is enlarged measuring 22.4 cm in cranial caudal dimension with mild hepatic steatosis. Gallbladder is physiologically distended. No biliary dilatation. Spleen is at the upper limits of normal in size measuring 12 cm. The pancreas and adrenal glands are normal. Kidneys demonstrate symmetric enhancement and excretion. No hydronephrosis. There is an 11 mm hypodensity in the upper left kidney.  Small hiatal hernia. Stomach is decompressed. There are no dilated or thickened bowel loops. The appendix is normal. Small volume of colonic stool. Borderline prominent mesenteric lymph nodes  in the ileocolic chain. No free air, free fluid, or intra-abdominal fluid collection.  Abdominal aorta is normal in caliber. No retroperitoneal  adenopathy. Small fat containing umbilical hernia.  Within the pelvis the urinary bladder is minimally distended. Uterus is surgically absent. Both ovaries are tentatively identified and normal, no adnexal mass. There is no pelvic free fluid. No pelvic adenopathy.  There are no acute or suspicious osseous abnormalities. Small bone island in the right iliac bone, unchanged. Mild degenerative change in the lumbar spine.  Review of the MIP images confirms the above findings.  IMPRESSION: 1. No pulmonary embolus or acute intrathoracic process. 2. Borderline prominent mesenteric lymph nodes in the ileocolic chain, may reflect mild mesenteric adenitis. 3. There is otherwise no acute abnormality in the abdomen/pelvis. Small left renal hypodensity, likely cyst, and fat containing periumbilical hernia, unchanged.   Electronically Signed   By: Jeb Levering M.D.   On: 07/11/2014 20:55       Francine Graven, DO 07/12/14 1428

## 2014-07-11 NOTE — ED Notes (Signed)
Fluid challenge completed. Pt states she cannot tolerate drinking without "it pushing back up", pt continues to moan and state she is having "discomfort",

## 2014-07-12 ENCOUNTER — Encounter (HOSPITAL_COMMUNITY): Payer: Self-pay | Admitting: Internal Medicine

## 2014-07-12 DIAGNOSIS — R197 Diarrhea, unspecified: Secondary | ICD-10-CM | POA: Diagnosis not present

## 2014-07-12 DIAGNOSIS — E785 Hyperlipidemia, unspecified: Secondary | ICD-10-CM | POA: Diagnosis present

## 2014-07-12 DIAGNOSIS — K3184 Gastroparesis: Secondary | ICD-10-CM | POA: Diagnosis present

## 2014-07-12 DIAGNOSIS — R1110 Vomiting, unspecified: Secondary | ICD-10-CM

## 2014-07-12 DIAGNOSIS — D649 Anemia, unspecified: Secondary | ICD-10-CM | POA: Diagnosis present

## 2014-07-12 DIAGNOSIS — I252 Old myocardial infarction: Secondary | ICD-10-CM | POA: Diagnosis not present

## 2014-07-12 DIAGNOSIS — E861 Hypovolemia: Secondary | ICD-10-CM | POA: Diagnosis present

## 2014-07-12 DIAGNOSIS — E118 Type 2 diabetes mellitus with unspecified complications: Secondary | ICD-10-CM | POA: Diagnosis not present

## 2014-07-12 DIAGNOSIS — D259 Leiomyoma of uterus, unspecified: Secondary | ICD-10-CM | POA: Diagnosis present

## 2014-07-12 DIAGNOSIS — E1143 Type 2 diabetes mellitus with diabetic autonomic (poly)neuropathy: Secondary | ICD-10-CM | POA: Diagnosis present

## 2014-07-12 DIAGNOSIS — K219 Gastro-esophageal reflux disease without esophagitis: Secondary | ICD-10-CM | POA: Diagnosis present

## 2014-07-12 DIAGNOSIS — Z87891 Personal history of nicotine dependence: Secondary | ICD-10-CM | POA: Diagnosis not present

## 2014-07-12 DIAGNOSIS — I10 Essential (primary) hypertension: Secondary | ICD-10-CM | POA: Diagnosis present

## 2014-07-12 DIAGNOSIS — F419 Anxiety disorder, unspecified: Secondary | ICD-10-CM | POA: Diagnosis present

## 2014-07-12 DIAGNOSIS — R079 Chest pain, unspecified: Secondary | ICD-10-CM | POA: Diagnosis not present

## 2014-07-12 DIAGNOSIS — Z8541 Personal history of malignant neoplasm of cervix uteri: Secondary | ICD-10-CM | POA: Diagnosis not present

## 2014-07-12 DIAGNOSIS — R112 Nausea with vomiting, unspecified: Secondary | ICD-10-CM | POA: Diagnosis present

## 2014-07-12 DIAGNOSIS — R109 Unspecified abdominal pain: Secondary | ICD-10-CM | POA: Diagnosis not present

## 2014-07-12 DIAGNOSIS — K529 Noninfective gastroenteritis and colitis, unspecified: Secondary | ICD-10-CM | POA: Diagnosis present

## 2014-07-12 DIAGNOSIS — K589 Irritable bowel syndrome without diarrhea: Secondary | ICD-10-CM | POA: Diagnosis not present

## 2014-07-12 DIAGNOSIS — Z794 Long term (current) use of insulin: Secondary | ICD-10-CM | POA: Diagnosis not present

## 2014-07-12 LAB — URINE MICROSCOPIC-ADD ON

## 2014-07-12 LAB — URINALYSIS, ROUTINE W REFLEX MICROSCOPIC
Bilirubin Urine: NEGATIVE
Glucose, UA: >1000 mg/dL — AB
Hgb urine dipstick: NEGATIVE
KETONES UR: NEGATIVE mg/dL
LEUKOCYTES UA: NEGATIVE
NITRITE: NEGATIVE
PH: 5.5 (ref 5.0–8.0)
PROTEIN: NEGATIVE mg/dL
Specific Gravity, Urine: 1.02 (ref 1.005–1.030)
UROBILINOGEN UA: 0.2 mg/dL (ref 0.0–1.0)

## 2014-07-12 LAB — CBC
HCT: 35 % — ABNORMAL LOW (ref 36.0–46.0)
HEMOGLOBIN: 10.2 g/dL — AB (ref 12.0–15.0)
MCH: 24.2 pg — AB (ref 26.0–34.0)
MCHC: 29.1 g/dL — AB (ref 30.0–36.0)
MCV: 82.9 fL (ref 78.0–100.0)
Platelets: 468 10*3/uL — ABNORMAL HIGH (ref 150–400)
RBC: 4.22 MIL/uL (ref 3.87–5.11)
RDW: 14.4 % (ref 11.5–15.5)
WBC: 13.9 10*3/uL — ABNORMAL HIGH (ref 4.0–10.5)

## 2014-07-12 LAB — GLUCOSE, CAPILLARY
GLUCOSE-CAPILLARY: 181 mg/dL — AB (ref 70–99)
Glucose-Capillary: 220 mg/dL — ABNORMAL HIGH (ref 70–99)
Glucose-Capillary: 251 mg/dL — ABNORMAL HIGH (ref 70–99)
Glucose-Capillary: 165 mg/dL — ABNORMAL HIGH (ref 70–99)
Glucose-Capillary: 160 mg/dL — ABNORMAL HIGH (ref 70–99)

## 2014-07-12 LAB — PREGNANCY, URINE: PREG TEST UR: NEGATIVE

## 2014-07-12 LAB — TROPONIN I: Troponin I: <0.03 ng/mL (ref <0.031)

## 2014-07-12 MED ORDER — METOCLOPRAMIDE HCL 5 MG/ML IJ SOLN
10.0000 mg | Freq: Four times a day (QID) | INTRAMUSCULAR | Status: DC
  Administered 2014-07-12 – 2014-07-13 (×5): 10 mg via INTRAVENOUS
  Filled 2014-07-12 (×5): qty 2

## 2014-07-12 MED ORDER — PANTOPRAZOLE SODIUM 40 MG IV SOLR
40.0000 mg | Freq: Two times a day (BID) | INTRAVENOUS | Status: DC
  Administered 2014-07-12 – 2014-07-14 (×5): 40 mg via INTRAVENOUS
  Filled 2014-07-12 (×5): qty 40

## 2014-07-12 MED ORDER — ONDANSETRON HCL 4 MG/2ML IJ SOLN
4.0000 mg | Freq: Four times a day (QID) | INTRAMUSCULAR | Status: DC
  Administered 2014-07-12 – 2014-07-14 (×9): 4 mg via INTRAVENOUS
  Filled 2014-07-12 (×9): qty 2

## 2014-07-12 MED ORDER — POTASSIUM CHLORIDE IN NACL 20-0.9 MEQ/L-% IV SOLN
INTRAVENOUS | Status: DC
  Administered 2014-07-12 – 2014-07-14 (×4): via INTRAVENOUS

## 2014-07-12 MED ORDER — PROMETHAZINE HCL 25 MG/ML IJ SOLN: 12.5000 mg | Freq: Four times a day (QID) | INTRAMUSCULAR | Status: DC | PRN

## 2014-07-12 MED ORDER — ENOXAPARIN SODIUM 80 MG/0.8ML ~~LOC~~ SOLN
70.0000 mg | SUBCUTANEOUS | Status: DC
  Administered 2014-07-12 – 2014-07-13 (×2): 70 mg via SUBCUTANEOUS
  Filled 2014-07-12 (×2): qty 0.8

## 2014-07-12 NOTE — Progress Notes (Signed)
Inpatient Diabetes Program Recommendations  AACE/ADA: New Consensus Statement on Inpatient Glycemic Control (2013)  Target Ranges:  Prepandial:   less than 140 mg/dL      Peak postprandial:   less than 180 mg/dL (1-2 hours)      Critically ill patients:  140 - 180 mg/dL  Results for DANUTA, HUSEMAN (MRN [id number]) as of 07/12/2014 08:24  Ref. Range 07/11/2014 23:59 07/12/2014 07:49  Glucose-Capillary Latest Range: 70-99 mg/dL 181 (H) 220 (H)   Diabetes history: DM2 Outpatient Diabetes medications: Januvia 100 mg daily, Metformin 500 mg BID Current orders for Inpatient glycemic control: Novolog 0-9 units TID  Inpatient Diabetes Program Recommendations Insulin - Basal: May want to consider ordering low dose basal insulin if CBGs continue to be greater than 180 mg/dl. Correction (SSI): Please consider increasing Novolog correction to moderate scale.  Thanks, Barnie Alderman, RN, MSN, CCRN, CDE Diabetes Coordinator Inpatient Diabetes Program [phone] (Team Pager from [place] to [place]) [phone] (AP office) [phone] [facility] office)

## 2014-07-12 NOTE — Care Management Note (Addendum)
    Page 1 of 1   07/14/2014     11:21:35 AM CARE MANAGEMENT NOTE 07/14/2014  Patient:  Monica Fernandez, Monica Fernandez   Account Number:  [id number]  Date Initiated:  07/12/2014  Documentation initiated by:  Theophilus Kinds  Subjective/Objective Assessment:   Pt admitted from home with nausea and vomiting. Pt lives with her husband and will return home at discharge. Pt is independent with ADL's.     Action/Plan:   No CM needs noted.   Anticipated DC Date:  07/14/2014   Anticipated DC Plan:  [place]  CM consult      Choice offered to / List presented to:             Status of service:  Completed, signed off Medicare Important Message given?   (If response is "NO", the following Medicare IM given date fields will be blank) Date Medicare IM given:   Medicare IM given by:   Date Additional Medicare IM given:   Additional Medicare IM given by:    Discharge Disposition:  HOME/SELF CARE  Per UR Regulation:    If discussed at Long Length of Stay Meetings, dates discussed:    Comments:  07/14/14 Julian, RN BSN CM Pt still on full liquids and still nauseated and abd pain. Will continue to follow for discharge planning needs.  07/12/14 [place], RN BSN CM

## 2014-07-12 NOTE — Progress Notes (Signed)
Patient/Family oriented to room. Information packet given to patient/family. Admission inpatient armband information verified with patient/family to include name and date of birth and placed on patient arm. Side rails up x 2, fall assessment and education completed with patient/family. Call light within reach. Patient/family able to voice and demonstrate understanding of unit orientation instructions  

## 2014-07-12 NOTE — Progress Notes (Signed)
UR completed 

## 2014-07-12 NOTE — Progress Notes (Signed)
Pt unable to provide a stool sample for CDiff by PCR due to no bowel movement. Will continue to monitor patient. Sample still pending.

## 2014-07-12 NOTE — Progress Notes (Signed)
[facility] PROGRESS NOTE  ULDINE FUSTER IHK:[id number] DOB: November 28, 1970 DOA: 07/11/2014 PCP: Sudie Grumbling, DO    Code Status: Full code Family Communication: Discussed with patient; family not available Disposition Plan: Discharge when clinically appropriate   Consultants:  None  Procedures:  None  Antibiotics:  None  HPI/Subjective: The patient still complains of some intermittent nausea and vomiting, but is trying to tolerate clear liquids. Abdominal pain is a little less than last night. She denies pain with urination. She has not had a bowel movement since yesterday afternoon.  Objective: Filed Vitals:   07/12/14 0821  BP: 130/65  Pulse: 88  Temp: 97.9 F (36.6 C)  Resp: 16    Intake/Output Summary (Last 24 hours) at 07/12/14 1356 Last data filed at 07/12/14 7564  Gross per 24 hour  Intake   3018 ml  Output    250 ml  Net   2768 ml   Filed Weights   07/11/14 1807 07/11/14 2325 07/12/14 0514  Weight: 141.522 kg (312 lb) 141.7 kg (312 lb 6.3 oz) 141.9 kg (312 lb 13.3 oz)    Exam:   General:  Morbidly obese Caucasian woman laying in bed, in no acute distress.  Cardiovascular: S1, S2, no murmurs rubs or gallops.  Respiratory: Clear anteriorly with decreased breath sounds in the bases.  Abdomen: Morbidly obese, positive bowel sounds, soft, mildly tender in the epigastrium; no rigidity or distention or masses palpated.  Musculoskeletal: Pedal pulses palpable. No pedal edema.   Data Reviewed: Basic Metabolic Panel:  Recent Labs Lab 07/11/14 1845  NA 136  K 4.0  CL 102  CO2 25  GLUCOSE 256*  BUN 14  CREATININE 0.78  CALCIUM 9.7   Liver Function Tests:  Recent Labs Lab 07/11/14 1850  AST 19  ALT 20  ALKPHOS 106  BILITOT 0.7  PROT 8.4*  ALBUMIN 4.3    Recent Labs Lab 07/11/14 1850  LIPASE 19   No results for input(s): AMMONIA in the last 168 hours. CBC:  Recent Labs Lab 07/11/14 1845 07/12/14 0419  WBC 14.2* 13.9*   NEUTROABS 10.4*  --   HGB 11.6* 10.2*  HCT 37.5 35.0*  MCV 81.7 82.9  PLT 479* 468*   Cardiac Enzymes:  Recent Labs Lab 07/11/14 1850 07/11/14 2252 07/12/14 0419  TROPONINI <0.03 <0.03 <0.03   BNP (last 3 results) No results for input(s): BNP in the last 8760 hours.  ProBNP (last 3 results) No results for input(s): PROBNP in the last 8760 hours.  CBG:  Recent Labs Lab 07/11/14 2359 07/12/14 0749 07/12/14 1120  GLUCAP 181* 220* 251*    No results found for this or any previous visit (from the past 240 hour(s)).   Studies: Ct Angio Chest Pe W/cm &/or Wo Cm  07/11/2014   CLINICAL DATA:  Upper abdominal pain radiating in chest and left shoulder. Abdominal pain started 2 days prior, chest radiation 1 day prior. Nausea, vomiting, and diarrhea.  EXAM: CT ANGIOGRAPHY CHEST  CT ABDOMEN AND PELVIS WITH CONTRAST  TECHNIQUE: Multidetector CT imaging of the chest was performed using the standard protocol during bolus administration of intravenous contrast. Multiplanar CT image reconstructions and MIPs were obtained to evaluate the vascular anatomy. Multidetector CT imaging of the abdomen and pelvis was performed using the standard protocol during bolus administration of intravenous contrast.  CONTRAST:  124mL OMNIPAQUE IOHEXOL 350 MG/ML SOLN  COMPARISON:  Radiographs earlier this date. CT abdomen/ pelvis 02/21/2014  FINDINGS: CTA CHEST FINDINGS  There are no filling  defects within the pulmonary arteries to suggest pulmonary embolus.  The thoracic aorta is normal in caliber without dissection. Heart at the upper limits of normal in size. No pleural or pericardial effusion. No mediastinal or hilar adenopathy. Mild hypoventilatory changes at the lung bases, the lungs are otherwise clear. No pulmonary nodule or mass. No consolidation.  There are no acute or suspicious osseous abnormalities.  CT ABDOMEN and PELVIS FINDINGS  Liver is enlarged measuring 22.4 cm in cranial caudal dimension with mild  hepatic steatosis. Gallbladder is physiologically distended. No biliary dilatation. Spleen is at the upper limits of normal in size measuring 12 cm. The pancreas and adrenal glands are normal. Kidneys demonstrate symmetric enhancement and excretion. No hydronephrosis. There is an 11 mm hypodensity in the upper left kidney.  Small hiatal hernia. Stomach is decompressed. There are no dilated or thickened bowel loops. The appendix is normal. Small volume of colonic stool. Borderline prominent mesenteric lymph nodes in the ileocolic chain. No free air, free fluid, or intra-abdominal fluid collection.  Abdominal aorta is normal in caliber. No retroperitoneal adenopathy. Small fat containing umbilical hernia.  Within the pelvis the urinary bladder is minimally distended. Uterus is surgically absent. Both ovaries are tentatively identified and normal, no adnexal mass. There is no pelvic free fluid. No pelvic adenopathy.  There are no acute or suspicious osseous abnormalities. Small bone island in the right iliac bone, unchanged. Mild degenerative change in the lumbar spine.  Review of the MIP images confirms the above findings.  IMPRESSION: 1. No pulmonary embolus or acute intrathoracic process. 2. Borderline prominent mesenteric lymph nodes in the ileocolic chain, may reflect mild mesenteric adenitis. 3. There is otherwise no acute abnormality in the abdomen/pelvis. Small left renal hypodensity, likely cyst, and fat containing periumbilical hernia, unchanged.   Electronically Signed   By: Jeb Levering M.D.   On: 07/11/2014 20:55   Ct Abdomen Pelvis W Contrast  07/11/2014   CLINICAL DATA:  Upper abdominal pain radiating in chest and left shoulder. Abdominal pain started 2 days prior, chest radiation 1 day prior. Nausea, vomiting, and diarrhea.  EXAM: CT ANGIOGRAPHY CHEST  CT ABDOMEN AND PELVIS WITH CONTRAST  TECHNIQUE: Multidetector CT imaging of the chest was performed using the standard protocol during bolus  administration of intravenous contrast. Multiplanar CT image reconstructions and MIPs were obtained to evaluate the vascular anatomy. Multidetector CT imaging of the abdomen and pelvis was performed using the standard protocol during bolus administration of intravenous contrast.  CONTRAST:  138mL OMNIPAQUE IOHEXOL 350 MG/ML SOLN  COMPARISON:  Radiographs earlier this date. CT abdomen/ pelvis 02/21/2014  FINDINGS: CTA CHEST FINDINGS  There are no filling defects within the pulmonary arteries to suggest pulmonary embolus.  The thoracic aorta is normal in caliber without dissection. Heart at the upper limits of normal in size. No pleural or pericardial effusion. No mediastinal or hilar adenopathy. Mild hypoventilatory changes at the lung bases, the lungs are otherwise clear. No pulmonary nodule or mass. No consolidation.  There are no acute or suspicious osseous abnormalities.  CT ABDOMEN and PELVIS FINDINGS  Liver is enlarged measuring 22.4 cm in cranial caudal dimension with mild hepatic steatosis. Gallbladder is physiologically distended. No biliary dilatation. Spleen is at the upper limits of normal in size measuring 12 cm. The pancreas and adrenal glands are normal. Kidneys demonstrate symmetric enhancement and excretion. No hydronephrosis. There is an 11 mm hypodensity in the upper left kidney.  Small hiatal hernia. Stomach is decompressed. There are no dilated  or thickened bowel loops. The appendix is normal. Small volume of colonic stool. Borderline prominent mesenteric lymph nodes in the ileocolic chain. No free air, free fluid, or intra-abdominal fluid collection.  Abdominal aorta is normal in caliber. No retroperitoneal adenopathy. Small fat containing umbilical hernia.  Within the pelvis the urinary bladder is minimally distended. Uterus is surgically absent. Both ovaries are tentatively identified and normal, no adnexal mass. There is no pelvic free fluid. No pelvic adenopathy.  There are no acute or  suspicious osseous abnormalities. Small bone island in the right iliac bone, unchanged. Mild degenerative change in the lumbar spine.  Review of the MIP images confirms the above findings.  IMPRESSION: 1. No pulmonary embolus or acute intrathoracic process. 2. Borderline prominent mesenteric lymph nodes in the ileocolic chain, may reflect mild mesenteric adenitis. 3. There is otherwise no acute abnormality in the abdomen/pelvis. Small left renal hypodensity, likely cyst, and fat containing periumbilical hernia, unchanged.   Electronically Signed   By: Jeb Levering M.D.   On: 07/11/2014 20:55   Dg Abd Acute W/chest  07/11/2014   CLINICAL DATA:  Abdominal pain, nausea, vomiting, diarrhea  EXAM: ACUTE ABDOMEN SERIES (ABDOMEN 2 VIEW & CHEST 1 VIEW)  COMPARISON:  None.  FINDINGS: There is no evidence of dilated bowel loops or free intraperitoneal air. No radiopaque calculi or other significant radiographic abnormality is seen. Heart size and mediastinal contours are within normal limits. Both lungs are clear. Moderate stool noted in right colon.  IMPRESSION: Normal small bowel gas pattern. Moderate stool noted in right colon. No acute cardiopulmonary disease.   Electronically Signed   By: Lahoma Crocker M.D.   On: 07/11/2014 19:48    Scheduled Meds: . amLODipine  10 mg Oral Daily  . enoxaparin (LOVENOX) injection  70 mg Subcutaneous Q24H  . insulin aspart  0-9 Units Subcutaneous TID WC  . metoCLOPramide (REGLAN) injection  10 mg Intravenous 4 times per day  . metoprolol  100 mg Oral BID  . ondansetron (ZOFRAN) IV  4 mg Intravenous 4 times per day  . pantoprazole (PROTONIX) IV  40 mg Intravenous Q12H  . sodium chloride  3 mL Intravenous Q12H   Continuous Infusions: . sodium chloride 125 mL/hr at 07/11/14 2334  . 0.9 % NaCl with KCl 20 mEq / L      Assessment and plan:  Principal Problem:   Intractable nausea and vomiting Active Problems:   Diarrhea   Diabetic gastroparesis   Sinus tachycardia    Chest pain   IBS (irritable bowel syndrome)   GERD (gastroesophageal reflux disease)   Leukocytosis   Essential hypertension   Diabetes mellitus with complication   Morbid obesity   1. Intractable nausea and vomiting. Etiology unclear, but likely secondary to acute on chronic diabetic gastroparesis. Also consider an acute viral gastroenteritis or gastritis. Patient reports being diagnosed with diabetic gastroparesis little over 2 years ago by gastroenterologist in [place]. She has been lost to follow-up as her gastroenterologist has retired. CT of her abdomen and pelvis revealed prominent mesentery lymph nodes which may reflect mild mesenteric adenitis; otherwise there is no acute abnormality in the abdomen/pelvis. We'll hold on antibiotic therapy for now. -Urinalysis pending. -We'll continue IV fluid hydration. -Have added IV Protonix every 12 hours. Have changed Reglan from by mouth to IV-every 6 hours-scheduled.. Have added Zofran IV every 6 hours-scheduled. -Continue clear liquid diet as tolerated.  Watery diarrhea. Patient denies any recent travel or antibiotic therapy. She does have a history of  irritable bowel syndrome. We'll place on contact precautions in order C. difficile PCR.  Chest pain, atypical and likely GI in origin. Troponin I negative 3. CT angiogram of the chest negative for PE. Continue twice a day dosing of PPI IV.  Hypertension. The patient is on amlodipine and metoprolol chronically. She was hypertensive on admission secondary to her being unable to hold oral medications down. These have been restarted orally which she appears to be tolerating for now. Her blood pressure has improved.  Diabetes mellitus, insulin-dependent. CBGs currently not optimal, but patient's intake has been marginal. We'll increase sliding scale NovoLog to moderate scale. Hemoglobin A1c pending.    Time spent: 35 minutes.    [facility] Pager  [phone]. If 7PM-7AM, please contact night-coverage at [email or web address], password [facility] 07/12/2014, 1:56 PM

## 2014-07-13 LAB — GLUCOSE, CAPILLARY
GLUCOSE-CAPILLARY: 195 mg/dL — AB (ref 70–99)
Glucose-Capillary: 223 mg/dL — ABNORMAL HIGH (ref 70–99)
Glucose-Capillary: 167 mg/dL — ABNORMAL HIGH (ref 70–99)
Glucose-Capillary: 155 mg/dL — ABNORMAL HIGH (ref 70–99)

## 2014-07-13 LAB — CBC
HCT: 34.7 % — ABNORMAL LOW (ref 36.0–46.0)
Hemoglobin: 10 g/dL — ABNORMAL LOW (ref 12.0–15.0)
MCH: 24.5 pg — ABNORMAL LOW (ref 26.0–34.0)
MCHC: 28.8 g/dL — ABNORMAL LOW (ref 30.0–36.0)
MCV: 85 fL (ref 78.0–100.0)
PLATELETS: 440 10*3/uL — AB (ref 150–400)
RBC: 4.08 MIL/uL (ref 3.87–5.11)
RDW: 14.3 % (ref 11.5–15.5)
WBC: 11.3 10*3/uL — AB (ref 4.0–10.5)

## 2014-07-13 LAB — BASIC METABOLIC PANEL
Anion gap: 5 (ref 5–15)
BUN: 10 mg/dL (ref 6–23)
CHLORIDE: 107 mmol/L (ref 96–112)
CO2: 27 mmol/L (ref 19–32)
Calcium: 8.5 mg/dL (ref 8.4–10.5)
Creatinine, Ser: 0.67 mg/dL (ref 0.50–1.10)
GFR calc Af Amer: >90 mL/min (ref >90)
GLUCOSE: 208 mg/dL — AB (ref 70–99)
Potassium: 4.1 mmol/L (ref 3.5–5.1)
Sodium: 139 mmol/L (ref 135–145)

## 2014-07-13 MED ORDER — METOCLOPRAMIDE HCL 5 MG/ML IJ SOLN
5.0000 mg | Freq: Four times a day (QID) | INTRAMUSCULAR | Status: DC
  Administered 2014-07-13 – 2014-07-14 (×4): 5 mg via INTRAVENOUS
  Filled 2014-07-13 (×4): qty 2

## 2014-07-13 MED ORDER — METRONIDAZOLE IVPB CUSTOM
250.0000 mg | Freq: Three times a day (TID) | INTRAVENOUS | Status: DC
  Administered 2014-07-13 – 2014-07-14 (×4): 250 mg via INTRAVENOUS
  Filled 2014-07-13: qty 100
  Filled 2014-07-13 (×2): qty 50
  Filled 2014-07-13: qty 100
  Filled 2014-07-13 (×4): qty 50
  Filled 2014-07-13: qty 100
  Filled 2014-07-13 (×6): qty 50
  Filled 2014-07-13: qty 100
  Filled 2014-07-13: qty 50

## 2014-07-13 MED ORDER — INSULIN ASPART 100 UNIT/ML ~~LOC~~ SOLN: 0.0000 [IU] | Freq: Every day | SUBCUTANEOUS | Status: DC

## 2014-07-13 MED ORDER — INSULIN GLARGINE 100 UNIT/ML ~~LOC~~ SOLN
12.0000 [IU] | Freq: Every day | SUBCUTANEOUS | Status: DC
  Administered 2014-07-13: 12 [IU] via SUBCUTANEOUS
  Filled 2014-07-13 (×4): qty 0.12

## 2014-07-13 MED ORDER — INSULIN ASPART 100 UNIT/ML ~~LOC~~ SOLN
0.0000 [IU] | Freq: Three times a day (TID) | SUBCUTANEOUS | Status: DC
  Administered 2014-07-13: 3 [IU] via SUBCUTANEOUS
  Administered 2014-07-14: 5 [IU] via SUBCUTANEOUS
  Administered 2014-07-14: 3 [IU] via SUBCUTANEOUS
  Administered 2014-07-14: 2 [IU] via SUBCUTANEOUS

## 2014-07-13 NOTE — Progress Notes (Signed)
Inpatient Diabetes Program Recommendations  AACE/ADA: New Consensus Statement on Inpatient Glycemic Control (2013)  Target Ranges:  Prepandial:   less than 140 mg/dL      Peak postprandial:   less than 180 mg/dL (1-2 hours)      Critically ill patients:  140 - 180 mg/dL   Results for BOBBIEJO, ISHIKAWA (MRN [id number]) as of 07/13/2014 08:20  Ref. Range 07/12/2014 07:49 07/12/2014 11:20 07/12/2014 16:57 07/12/2014 22:00 07/13/2014 07:24  Glucose-Capillary Latest Range: 70-99 mg/dL 220 (H) 251 (H) 165 (H) 160 (H) 195 (H)   Diabetes history: DM2 Outpatient Diabetes medications: Januvia 100 mg daily, Metformin 500 mg BID Current orders for Inpatient glycemic control: Novolog 0-9 units TID  Inpatient Diabetes Program Recommendations Correction (SSI): Please consider increasing Novolog correction to moderate scale and adding Novolog bedtime correction scale.  Thanks, Barnie Alderman, RN, MSN, CCRN, CDE Diabetes Coordinator Inpatient Diabetes Program [phone] (Team Pager from [place] to [place]) [phone] ([place] office) [phone] [facility] office)

## 2014-07-13 NOTE — Progress Notes (Signed)
[facility] PROGRESS NOTE  Monica Fernandez:[id number] DOB: 1971/01/11 DOA: 07/11/2014 PCP: Sudie Grumbling, DO    Code Status: Full code Family Communication: Discussed with patient; family not available Disposition Plan: Discharge when clinically appropriate   Consultants:  None  Procedures:  None  Antibiotics:  Flagyl 3/31>>  HPI/Subjective: The patient still complains of some intermittent nausea and epigastric abdominal pain, but no vomiting. She was able to keep clear liquids down this morning.  Objective: Filed Vitals:   07/13/14 0543  BP: 140/69  Pulse: 90  Temp: 97.7 F (36.5 C)  Resp: 20   oxygen saturation 95% on room air  Intake/Output Summary (Last 24 hours) at 07/13/14 1214 Last data filed at 07/13/14 0800  Gross per 24 hour  Intake   2100 ml  Output    700 ml  Net   1400 ml   Filed Weights   07/11/14 1807 07/11/14 2325 07/12/14 0514  Weight: 141.522 kg (312 lb) 141.7 kg (312 lb 6.3 oz) 141.9 kg (312 lb 13.3 oz)    Exam:   General:  Morbidly obese Caucasian woman laying in bed, in no acute distress.  Cardiovascular: S1, S2, no murmurs rubs or gallops.  Respiratory: Clear anteriorly with decreased breath sounds in the bases.  Abdomen: Morbidly obese, positive bowel sounds, soft, mildly tender in the epigastrium; no rigidity or distention or masses palpated.  Musculoskeletal: Pedal pulses palpable. No pedal edema.   Data Reviewed: Basic Metabolic Panel:  Recent Labs Lab 07/11/14 1845 07/13/14 0603  NA 136 139  K 4.0 4.1  CL 102 107  CO2 25 27  GLUCOSE 256* 208*  BUN 14 10  CREATININE 0.78 0.67  CALCIUM 9.7 8.5   Liver Function Tests:  Recent Labs Lab 07/11/14 1850  AST 19  ALT 20  ALKPHOS 106  BILITOT 0.7  PROT 8.4*  ALBUMIN 4.3    Recent Labs Lab 07/11/14 1850  LIPASE 19   No results for input(s): AMMONIA in the last 168 hours. CBC:  Recent Labs Lab 07/11/14 1845 07/12/14 0419 07/13/14 0603  WBC  14.2* 13.9* 11.3*  NEUTROABS 10.4*  --   --   HGB 11.6* 10.2* 10.0*  HCT 37.5 35.0* 34.7*  MCV 81.7 82.9 85.0  PLT 479* 468* 440*   Cardiac Enzymes:  Recent Labs Lab 07/11/14 1850 07/11/14 2252 07/12/14 0419  TROPONINI <0.03 <0.03 <0.03   BNP (last 3 results) No results for input(s): BNP in the last 8760 hours.  ProBNP (last 3 results) No results for input(s): PROBNP in the last 8760 hours.  CBG:  Recent Labs Lab 07/12/14 1120 07/12/14 1657 07/12/14 2200 07/13/14 0724 07/13/14 1114  GLUCAP 251* 165* 160* 195* 223*    No results found for this or any previous visit (from the past 240 hour(s)).   Studies: Ct Angio Chest Pe W/cm &/or Wo Cm  07/11/2014   CLINICAL DATA:  Upper abdominal pain radiating in chest and left shoulder. Abdominal pain started 2 days prior, chest radiation 1 day prior. Nausea, vomiting, and diarrhea.  EXAM: CT ANGIOGRAPHY CHEST  CT ABDOMEN AND PELVIS WITH CONTRAST  TECHNIQUE: Multidetector CT imaging of the chest was performed using the standard protocol during bolus administration of intravenous contrast. Multiplanar CT image reconstructions and MIPs were obtained to evaluate the vascular anatomy. Multidetector CT imaging of the abdomen and pelvis was performed using the standard protocol during bolus administration of intravenous contrast.  CONTRAST:  11mL OMNIPAQUE IOHEXOL 350 MG/ML SOLN  COMPARISON:  Radiographs  earlier this date. CT abdomen/ pelvis 02/21/2014  FINDINGS: CTA CHEST FINDINGS  There are no filling defects within the pulmonary arteries to suggest pulmonary embolus.  The thoracic aorta is normal in caliber without dissection. Heart at the upper limits of normal in size. No pleural or pericardial effusion. No mediastinal or hilar adenopathy. Mild hypoventilatory changes at the lung bases, the lungs are otherwise clear. No pulmonary nodule or mass. No consolidation.  There are no acute or suspicious osseous abnormalities.  CT ABDOMEN and PELVIS  FINDINGS  Liver is enlarged measuring 22.4 cm in cranial caudal dimension with mild hepatic steatosis. Gallbladder is physiologically distended. No biliary dilatation. Spleen is at the upper limits of normal in size measuring 12 cm. The pancreas and adrenal glands are normal. Kidneys demonstrate symmetric enhancement and excretion. No hydronephrosis. There is an 11 mm hypodensity in the upper left kidney.  Small hiatal hernia. Stomach is decompressed. There are no dilated or thickened bowel loops. The appendix is normal. Small volume of colonic stool. Borderline prominent mesenteric lymph nodes in the ileocolic chain. No free air, free fluid, or intra-abdominal fluid collection.  Abdominal aorta is normal in caliber. No retroperitoneal adenopathy. Small fat containing umbilical hernia.  Within the pelvis the urinary bladder is minimally distended. Uterus is surgically absent. Both ovaries are tentatively identified and normal, no adnexal mass. There is no pelvic free fluid. No pelvic adenopathy.  There are no acute or suspicious osseous abnormalities. Small bone island in the right iliac bone, unchanged. Mild degenerative change in the lumbar spine.  Review of the MIP images confirms the above findings.  IMPRESSION: 1. No pulmonary embolus or acute intrathoracic process. 2. Borderline prominent mesenteric lymph nodes in the ileocolic chain, may reflect mild mesenteric adenitis. 3. There is otherwise no acute abnormality in the abdomen/pelvis. Small left renal hypodensity, likely cyst, and fat containing periumbilical hernia, unchanged.   Electronically Signed   By: Jeb Levering M.D.   On: 07/11/2014 20:55   Ct Abdomen Pelvis W Contrast  07/11/2014   CLINICAL DATA:  Upper abdominal pain radiating in chest and left shoulder. Abdominal pain started 2 days prior, chest radiation 1 day prior. Nausea, vomiting, and diarrhea.  EXAM: CT ANGIOGRAPHY CHEST  CT ABDOMEN AND PELVIS WITH CONTRAST  TECHNIQUE: Multidetector  CT imaging of the chest was performed using the standard protocol during bolus administration of intravenous contrast. Multiplanar CT image reconstructions and MIPs were obtained to evaluate the vascular anatomy. Multidetector CT imaging of the abdomen and pelvis was performed using the standard protocol during bolus administration of intravenous contrast.  CONTRAST:  15mL OMNIPAQUE IOHEXOL 350 MG/ML SOLN  COMPARISON:  Radiographs earlier this date. CT abdomen/ pelvis 02/21/2014  FINDINGS: CTA CHEST FINDINGS  There are no filling defects within the pulmonary arteries to suggest pulmonary embolus.  The thoracic aorta is normal in caliber without dissection. Heart at the upper limits of normal in size. No pleural or pericardial effusion. No mediastinal or hilar adenopathy. Mild hypoventilatory changes at the lung bases, the lungs are otherwise clear. No pulmonary nodule or mass. No consolidation.  There are no acute or suspicious osseous abnormalities.  CT ABDOMEN and PELVIS FINDINGS  Liver is enlarged measuring 22.4 cm in cranial caudal dimension with mild hepatic steatosis. Gallbladder is physiologically distended. No biliary dilatation. Spleen is at the upper limits of normal in size measuring 12 cm. The pancreas and adrenal glands are normal. Kidneys demonstrate symmetric enhancement and excretion. No hydronephrosis. There is an 11 mm  hypodensity in the upper left kidney.  Small hiatal hernia. Stomach is decompressed. There are no dilated or thickened bowel loops. The appendix is normal. Small volume of colonic stool. Borderline prominent mesenteric lymph nodes in the ileocolic chain. No free air, free fluid, or intra-abdominal fluid collection.  Abdominal aorta is normal in caliber. No retroperitoneal adenopathy. Small fat containing umbilical hernia.  Within the pelvis the urinary bladder is minimally distended. Uterus is surgically absent. Both ovaries are tentatively identified and normal, no adnexal mass.  There is no pelvic free fluid. No pelvic adenopathy.  There are no acute or suspicious osseous abnormalities. Small bone island in the right iliac bone, unchanged. Mild degenerative change in the lumbar spine.  Review of the MIP images confirms the above findings.  IMPRESSION: 1. No pulmonary embolus or acute intrathoracic process. 2. Borderline prominent mesenteric lymph nodes in the ileocolic chain, may reflect mild mesenteric adenitis. 3. There is otherwise no acute abnormality in the abdomen/pelvis. Small left renal hypodensity, likely cyst, and fat containing periumbilical hernia, unchanged.   Electronically Signed   By: Jeb Levering M.D.   On: 07/11/2014 20:55   Dg Abd Acute W/chest  07/11/2014   CLINICAL DATA:  Abdominal pain, nausea, vomiting, diarrhea  EXAM: ACUTE ABDOMEN SERIES (ABDOMEN 2 VIEW & CHEST 1 VIEW)  COMPARISON:  None.  FINDINGS: There is no evidence of dilated bowel loops or free intraperitoneal air. No radiopaque calculi or other significant radiographic abnormality is seen. Heart size and mediastinal contours are within normal limits. Both lungs are clear. Moderate stool noted in right colon.  IMPRESSION: Normal small bowel gas pattern. Moderate stool noted in right colon. No acute cardiopulmonary disease.   Electronically Signed   By: Lahoma Crocker M.D.   On: 07/11/2014 19:48    Scheduled Meds: . amLODipine  10 mg Oral Daily  . enoxaparin (LOVENOX) injection  70 mg Subcutaneous Q24H  . insulin aspart  0-9 Units Subcutaneous TID WC  . metoCLOPramide (REGLAN) injection  10 mg Intravenous 4 times per day  . metoprolol  100 mg Oral BID  . metronidazole  250 mg Intravenous Q8H  . ondansetron (ZOFRAN) IV  4 mg Intravenous 4 times per day  . pantoprazole (PROTONIX) IV  40 mg Intravenous Q12H  . sodium chloride  3 mL Intravenous Q12H   Continuous Infusions: . sodium chloride 125 mL/hr at 07/11/14 2334  . 0.9 % NaCl with KCl 20 mEq / L 125 mL/hr at 07/13/14 0736    Assessment  and plan:  Principal Problem:   Intractable nausea and vomiting Active Problems:   Diarrhea   Diabetic gastroparesis   Sinus tachycardia   Chest pain   IBS (irritable bowel syndrome)   GERD (gastroesophageal reflux disease)   Leukocytosis   Essential hypertension   Diabetes mellitus with complication   Morbid obesity   1. Intractable nausea and vomiting. The patient was started on vigorous IV fluids and as needed Zofran. Because of her ongoing symptoms, Protonix was started IV every 12 hours and Reglan was changed from by mouth to IV every 6 hours scheduled. Zofran was subsequently added every 6 hours scheduled. Etiology unclear, but is likely secondary to acute on chronic diabetic gastroparesis with possible contribution from acute viral gastroenteritis or gastritis which could be causing mild mesenteric adenitis. Patient reports being diagnosed with diabetic gastroparesis little over 2 years ago by gastroenterologist in [place]. She has been lost to follow-up as her gastroenterologist has retired. CT of her abdomen and  pelvis revealed prominent mesentery lymph nodes which may reflect mild mesenteric adenitis; otherwise there is no acute abnormality in the abdomen/pelvis. Her urinalysis was not indicative of an acute infection. -We'll continue supportive treatment. We'll advance her diet to full liquids. -We'll add IV Flagyl at reduced dosing as to not exacerbate nausea. -We'll try decreasing Reglan to 5 mg IV.  History of watery diarrhea; prior to hospital discharge. Patient denie any recent travel or antibiotic therapy. She does have a history of irritable bowel syndrome. She was placed on contact precautions C. difficile PCR was ordered. However, the patient has not had a bowel movement in more than 24 hours, so C. difficile less likely. We'll discontinue lab and precautions.  Chest pain, atypical and likely GI in origin. Troponin I negative 3. CT angiogram of the chest negative  for PE. Continue twice a day dosing of PPI IV.  Hypertension. The patient is on amlodipine and metoprolol chronically. She was hypertensive on admission secondary to her being unable to hold oral medications down at home. They were restarted orally which she she tolerated. Her blood pressure has improved.  Diabetes mellitus, insulin-dependent. Her home metformin is being held. CBGs currently not optimal, but patient's intake has been marginal. Sliding scale NovoLog was increased to the moderate scale; will add bedtime coverage and Lantus. Hemoglobin A1c pending.    Time spent: 30 minutes.    [facility] Pager [phone]. If 7PM-7AM, please contact night-coverage at [email or web address], password [facility] 07/13/2014, 12:14 PM  LOS: 1 day

## 2014-07-14 ENCOUNTER — Encounter (HOSPITAL_COMMUNITY): Payer: Self-pay | Admitting: Internal Medicine

## 2014-07-14 DIAGNOSIS — I880 Nonspecific mesenteric lymphadenitis: Secondary | ICD-10-CM

## 2014-07-14 DIAGNOSIS — R079 Chest pain, unspecified: Secondary | ICD-10-CM | POA: Diagnosis present

## 2014-07-14 DIAGNOSIS — D649 Anemia, unspecified: Secondary | ICD-10-CM | POA: Diagnosis present

## 2014-07-14 DIAGNOSIS — R109 Unspecified abdominal pain: Secondary | ICD-10-CM

## 2014-07-14 LAB — HEMOGLOBIN A1C
Hgb A1c MFr Bld: 10.5 % — ABNORMAL HIGH (ref 4.8–5.6)
Mean Plasma Glucose: 255 mg/dL

## 2014-07-14 LAB — GLUCOSE, CAPILLARY
Glucose-Capillary: 196 mg/dL — ABNORMAL HIGH (ref 70–99)
Glucose-Capillary: 235 mg/dL — ABNORMAL HIGH (ref 70–99)
Glucose-Capillary: 146 mg/dL — ABNORMAL HIGH (ref 70–99)

## 2014-07-14 LAB — CBC
HCT: 33.7 % — ABNORMAL LOW (ref 36.0–46.0)
Hemoglobin: 9.9 g/dL — ABNORMAL LOW (ref 12.0–15.0)
MCH: 24.8 pg — ABNORMAL LOW (ref 26.0–34.0)
MCHC: 29.4 g/dL — AB (ref 30.0–36.0)
MCV: 84.3 fL (ref 78.0–100.0)
Platelets: 399 10*3/uL (ref 150–400)
RBC: 4 MIL/uL (ref 3.87–5.11)
RDW: 14.2 % (ref 11.5–15.5)
WBC: 9.4 10*3/uL (ref 4.0–10.5)

## 2014-07-14 MED ORDER — METRONIDAZOLE 250 MG PO TABS: 250.0000 mg | ORAL_TABLET | Freq: Three times a day (TID) | ORAL | Status: DC

## 2014-07-14 MED ORDER — INSULIN GLARGINE 100 UNIT/ML SOLOSTAR PEN: 20.0000 [IU] | PEN_INJECTOR | Freq: Every day | SUBCUTANEOUS | Status: AC

## 2014-07-14 MED ORDER — PROMETHAZINE HCL 25 MG PO TABS
12.5000 mg | ORAL_TABLET | Freq: Four times a day (QID) | ORAL | Status: DC | PRN
Start: 2014-07-14 — End: 2014-10-03

## 2014-07-14 MED ORDER — OXYCODONE-ACETAMINOPHEN 7.5-325 MG PO TABS: 1.0000 | ORAL_TABLET | ORAL | Status: DC | PRN

## 2014-07-14 NOTE — Progress Notes (Signed)
Monica Fernandez discharged home with husband per MD order.  Discharge instructions reviewed and discussed with the patient at bedside, all questions and concerns answered. Copy of instructions and scripts given to patient along with information on GI doctors in [place].    Medication List    STOP taking these medications        aspirin EC 325 MG tablet      TAKE these medications        amLODipine 10 MG tablet  Commonly known as:  NORVASC  Take 10 mg by mouth daily.     Insulin Glargine 100 UNIT/ML Solostar Pen  Commonly known as:  LANTUS  Inject 20 Units into the skin daily at 10 pm.     metoCLOPramide 10 MG tablet  Commonly known as:  REGLAN  Take 10 mg by mouth 4 (four) times daily.     metoprolol 100 MG tablet  Commonly known as:  LOPRESSOR  Take 100 mg by mouth 2 (two) times daily.     metroNIDAZOLE 250 MG tablet  Commonly known as:  FLAGYL  Take 1 tablet (250 mg total) by mouth 3 (three) times daily. Take for 7 more days     oxyCODONE-acetaminophen 7.5-325 MG per tablet  Commonly known as:  PERCOCET  Take 1 tablet by mouth every 4 (four) hours as needed for pain.     pantoprazole 40 MG tablet  Commonly known as:  PROTONIX  Take 40 mg by mouth daily.     promethazine 25 MG tablet  Commonly known as:  PHENERGAN  Take 0.5 tablets (12.5 mg total) by mouth every 6 (six) hours as needed for nausea or vomiting.     sitaGLIPtin 100 MG tablet  Commonly known as:  JANUVIA  Take 100 mg by mouth daily.        Patients skin is clean, dry and intact, no evidence of skin break down. IV site discontinued and catheter remains intact. Site without signs and symptoms of complications. Dressing and pressure applied.  Patient escorted to car by Letta Median, NT in a wheelchair,  no distress noted upon discharge.  Regino Bellow 07/14/2014 5:34 PM

## 2014-07-14 NOTE — Discharge Summary (Signed)
Physician Discharge Summary  Monica Fernandez JKD:[id number] DOB: Jul 17, 1970 DOA: 07/11/2014  PCP: Sudie Grumbling, DO  Admit date: 07/11/2014 Discharge date: 07/14/2014  Time spent: Greater than 30 minutes  Recommendations for Outpatient Follow-up:  1. Recommend PCP referral to gastroenterology electively for further evaluation of anemia and diabetic gastroparesis. (The patient was given names and numbers of the local gastroenterologists in [place]).    Discharge Diagnoses:  1. Intractable nausea and vomiting and epigastric abdominal pain, likely secondary to an acute gastroenteritis. 2. Mild mesenteric adenitis, likely the consequence of acute gastroenteritis. 3. Superimposed exacerbation of diabetic gastroparesis. 4. Reported diarrhea at home; not evident during the hospitalization. 5. Chest pain, atypical, likely secondary to nausea and vomiting. Myocardial infarction ruled out. CT angiogram negative for PE. 6. Essential hypertension. 7. Diabetes mellitus with diabetic gastroparesis. 8. Sinus tachycardia secondary to hypovolemia. 9. Morbid obesity. 10. GERD.     Discharge Condition: Improved.  Diet recommendation: Carbohydrate modified/heart healthy.  Filed Weights   07/11/14 1807 07/11/14 2325 07/12/14 0514  Weight: 141.522 kg (312 lb) 141.7 kg (312 lb 6.3 oz) 141.9 kg (312 lb 13.3 oz)    History of present illness:  The patient is a 44 year old woman with a history of diabetic gastroparesis, irritable bowel syndrome, GERD, and diabetes mellitus, who presented to the emergency department on 07/11/14 with a chief complaint of intractable nausea and vomiting. She also had several episodes of watery, nonbloody diarrhea and upper epigastric abdominal pain that radiated to her chest and her shoulder. In the ED, she was febrile with a temperature 100.1 degrees rectally, tachycardic, and mildly hypertensive. Her lab data were significant for venous glucose of 256, normal troponin I, and  WBC of 14.2. Her liver transaminases and lipase were within normal limits. She was admitted for further evaluation and management.  Hospital Course:   1. Intractable nausea and vomiting, epigastric abdominal pain, prior diarrhea. The patient was started on vigorous IV fluids and as needed Zofran. Because of her ongoing symptoms, Protonix was started IV every 12 hours and Reglan was changed from by mouth to IV every 6 hours scheduled. Zofran was subsequently added every 6 hours scheduled. For further evaluation, a number of studies were ordered. CT of her abdomen and pelvis revealed comminuted mesentery lymph nodes which may reflect mild mesenteric adenitis otherwise, there was no acute abdomen lesion in the abdomen/pelvis. Her urinalysis was not indicative of infection. C. difficile PCR was ordered, but the patient did not have a bowel movement in 36 hours, so it was canceled as it was less likely C. difficile colitis and more likely IBS. She was empirically started on IV Flagyl because of her relative leukocytosis. Her symptoms subsided and then resolved. Her diet was advanced slowly and she tolerated the advancement. The etiology of her symptom apology was likely secondary to acute gastroenteritis with reactive mild mesenteric adenitis. Exacerbation of diabetic gastroparesis was also likely. She was discharged on Reglan which she had been taking for the past couple years with no extraparametal side effects. She was also discharged on Protonix, as needed Phenergan, and as needed Percocet. She was given names of the local gastroenterologists as her gastroenterologist in [place] had retired.  Chest pain, atypical and likely GI in origin. Studies were ordered for evaluation. Her Troponin I was negative 3. CT angiogram of the chest was negative for PE. She was restarted on her PPI. Her symptoms resolved.  Normocytic anemia. The patient has a history of anemia per her account. She underwent a partial  hysterectomy secondary to uterine fibroids. She reports having a colonoscopy 3-5 years ago which showed "inflammation". She also had an EGD in the same timeframe which revealed "inflammation" in her stomach. She wants to be referred to a local gastroenterologist in [place] as her gastroenterologist in [place] had retired. She was given the names of the local gastroenterologists.  Hypertension. The patient takes amlodipine and metoprolol chronically. She was hypertensive on admission secondary to her being unable to hold oral medications down at home. They were restarted orally which she tolerated. Her blood pressure had improved.  Diabetes mellitus, non- insulin-dependent. It was reported that she had been on insulin for treatment, but she did not endorse this. She was taking Januvia chronically for treatment. Metformin had been discontinued by her PCP because of loose stools. During the hospitalization, her CBGs currently not optimal, but patient's intake has been marginal so sliding scale NovoLog was started and titrated slowly. Lantus was added subsequently for better control once she started eating. Her hemoglobin A1c was 10.5. I recommended long-acting insulin for outpatient treatment in addition to Januvia. She was receptive to this. She was given a prescription for Lantus pen- starting at 20 units daily at bedtime. She was advised to follow-up with her PCP for further management.      Procedures:  None  Consultations:  None  Discharge Exam: Filed Vitals:   07/14/14 0547  BP: 125/62  Pulse: 82  Temp: 98 F (36.7 C)  Resp: 20     General: Morbidly obese Caucasian woman laying in bed, in no acute distress.  Cardiovascular: S1, S2, no murmurs rubs or gallops.  Respiratory: Clear anteriorly with decreased breath sounds in the bases.  Abdomen: Morbidly obese, positive bowel sounds, soft, mildly tender in the epigastrium; no rigidity or distention or masses  palpated.  Musculoskeletal: Pedal pulses palpable. No pedal edema.  Discharge Instructions    Current Discharge Medication List    START taking these medications   Details  Insulin Glargine (LANTUS) 100 UNIT/ML Solostar Pen Inject 20 Units into the skin daily at 10 pm. Qty: 15 mL, Refills: 11    metroNIDAZOLE (FLAGYL) 250 MG tablet Take 1 tablet (250 mg total) by mouth 3 (three) times daily. Take for 7 more days Qty: 21 tablet, Refills: 0    oxyCODONE-acetaminophen (PERCOCET) 7.5-325 MG per tablet Take 1 tablet by mouth every 4 (four) hours as needed for pain. Qty: 30 tablet, Refills: 0    promethazine (PHENERGAN) 25 MG tablet Take 0.5 tablets (12.5 mg total) by mouth every 6 (six) hours as needed for nausea or vomiting. Qty: 30 tablet, Refills: 1      CONTINUE these medications which have NOT CHANGED   Details  amLODipine (NORVASC) 10 MG tablet Take 10 mg by mouth daily.    metoCLOPramide (REGLAN) 10 MG tablet Take 10 mg by mouth 4 (four) times daily.    metoprolol (LOPRESSOR) 100 MG tablet Take 100 mg by mouth 2 (two) times daily.    pantoprazole (PROTONIX) 40 MG tablet Take 40 mg by mouth daily.    sitaGLIPtin (JANUVIA) 100 MG tablet Take 100 mg by mouth daily.      STOP taking these medications     aspirin EC 325 MG tablet        No Known Allergies Follow-up Information    Follow up with Crawford, DO.   Specialty:  Family Medicine   Why:  Follow-up in one week.       The results of significant  diagnostics from this hospitalization (including imaging, microbiology, ancillary and laboratory) are listed below for reference.    Significant Diagnostic Studies: Ct Angio Chest Pe W/cm &/or Wo Cm  07/11/2014   CLINICAL DATA:  Upper abdominal pain radiating in chest and left shoulder. Abdominal pain started 2 days prior, chest radiation 1 day prior. Nausea, vomiting, and diarrhea.  EXAM: CT ANGIOGRAPHY CHEST  CT ABDOMEN AND PELVIS WITH CONTRAST  TECHNIQUE:  Multidetector CT imaging of the chest was performed using the standard protocol during bolus administration of intravenous contrast. Multiplanar CT image reconstructions and MIPs were obtained to evaluate the vascular anatomy. Multidetector CT imaging of the abdomen and pelvis was performed using the standard protocol during bolus administration of intravenous contrast.  CONTRAST:  111mL OMNIPAQUE IOHEXOL 350 MG/ML SOLN  COMPARISON:  Radiographs earlier this date. CT abdomen/ pelvis 02/21/2014  FINDINGS: CTA CHEST FINDINGS  There are no filling defects within the pulmonary arteries to suggest pulmonary embolus.  The thoracic aorta is normal in caliber without dissection. Heart at the upper limits of normal in size. No pleural or pericardial effusion. No mediastinal or hilar adenopathy. Mild hypoventilatory changes at the lung bases, the lungs are otherwise clear. No pulmonary nodule or mass. No consolidation.  There are no acute or suspicious osseous abnormalities.  CT ABDOMEN and PELVIS FINDINGS  Liver is enlarged measuring 22.4 cm in cranial caudal dimension with mild hepatic steatosis. Gallbladder is physiologically distended. No biliary dilatation. Spleen is at the upper limits of normal in size measuring 12 cm. The pancreas and adrenal glands are normal. Kidneys demonstrate symmetric enhancement and excretion. No hydronephrosis. There is an 11 mm hypodensity in the upper left kidney.  Small hiatal hernia. Stomach is decompressed. There are no dilated or thickened bowel loops. The appendix is normal. Small volume of colonic stool. Borderline prominent mesenteric lymph nodes in the ileocolic chain. No free air, free fluid, or intra-abdominal fluid collection.  Abdominal aorta is normal in caliber. No retroperitoneal adenopathy. Small fat containing umbilical hernia.  Within the pelvis the urinary bladder is minimally distended. Uterus is surgically absent. Both ovaries are tentatively identified and normal, no  adnexal mass. There is no pelvic free fluid. No pelvic adenopathy.  There are no acute or suspicious osseous abnormalities. Small bone island in the right iliac bone, unchanged. Mild degenerative change in the lumbar spine.  Review of the MIP images confirms the above findings.  IMPRESSION: 1. No pulmonary embolus or acute intrathoracic process. 2. Borderline prominent mesenteric lymph nodes in the ileocolic chain, may reflect mild mesenteric adenitis. 3. There is otherwise no acute abnormality in the abdomen/pelvis. Small left renal hypodensity, likely cyst, and fat containing periumbilical hernia, unchanged.   Electronically Signed   By: Jeb Levering M.D.   On: 07/11/2014 20:55   Ct Abdomen Pelvis W Contrast  07/11/2014   CLINICAL DATA:  Upper abdominal pain radiating in chest and left shoulder. Abdominal pain started 2 days prior, chest radiation 1 day prior. Nausea, vomiting, and diarrhea.  EXAM: CT ANGIOGRAPHY CHEST  CT ABDOMEN AND PELVIS WITH CONTRAST  TECHNIQUE: Multidetector CT imaging of the chest was performed using the standard protocol during bolus administration of intravenous contrast. Multiplanar CT image reconstructions and MIPs were obtained to evaluate the vascular anatomy. Multidetector CT imaging of the abdomen and pelvis was performed using the standard protocol during bolus administration of intravenous contrast.  CONTRAST:  113mL OMNIPAQUE IOHEXOL 350 MG/ML SOLN  COMPARISON:  Radiographs earlier this date. CT abdomen/ pelvis  02/21/2014  FINDINGS: CTA CHEST FINDINGS  There are no filling defects within the pulmonary arteries to suggest pulmonary embolus.  The thoracic aorta is normal in caliber without dissection. Heart at the upper limits of normal in size. No pleural or pericardial effusion. No mediastinal or hilar adenopathy. Mild hypoventilatory changes at the lung bases, the lungs are otherwise clear. No pulmonary nodule or mass. No consolidation.  There are no acute or suspicious  osseous abnormalities.  CT ABDOMEN and PELVIS FINDINGS  Liver is enlarged measuring 22.4 cm in cranial caudal dimension with mild hepatic steatosis. Gallbladder is physiologically distended. No biliary dilatation. Spleen is at the upper limits of normal in size measuring 12 cm. The pancreas and adrenal glands are normal. Kidneys demonstrate symmetric enhancement and excretion. No hydronephrosis. There is an 11 mm hypodensity in the upper left kidney.  Small hiatal hernia. Stomach is decompressed. There are no dilated or thickened bowel loops. The appendix is normal. Small volume of colonic stool. Borderline prominent mesenteric lymph nodes in the ileocolic chain. No free air, free fluid, or intra-abdominal fluid collection.  Abdominal aorta is normal in caliber. No retroperitoneal adenopathy. Small fat containing umbilical hernia.  Within the pelvis the urinary bladder is minimally distended. Uterus is surgically absent. Both ovaries are tentatively identified and normal, no adnexal mass. There is no pelvic free fluid. No pelvic adenopathy.  There are no acute or suspicious osseous abnormalities. Small bone island in the right iliac bone, unchanged. Mild degenerative change in the lumbar spine.  Review of the MIP images confirms the above findings.  IMPRESSION: 1. No pulmonary embolus or acute intrathoracic process. 2. Borderline prominent mesenteric lymph nodes in the ileocolic chain, may reflect mild mesenteric adenitis. 3. There is otherwise no acute abnormality in the abdomen/pelvis. Small left renal hypodensity, likely cyst, and fat containing periumbilical hernia, unchanged.   Electronically Signed   By: Jeb Levering M.D.   On: 07/11/2014 20:55   Dg Abd Acute W/chest  07/11/2014   CLINICAL DATA:  Abdominal pain, nausea, vomiting, diarrhea  EXAM: ACUTE ABDOMEN SERIES (ABDOMEN 2 VIEW & CHEST 1 VIEW)  COMPARISON:  None.  FINDINGS: There is no evidence of dilated bowel loops or free intraperitoneal air. No  radiopaque calculi or other significant radiographic abnormality is seen. Heart size and mediastinal contours are within normal limits. Both lungs are clear. Moderate stool noted in right colon.  IMPRESSION: Normal small bowel gas pattern. Moderate stool noted in right colon. No acute cardiopulmonary disease.   Electronically Signed   By: Lahoma Crocker M.D.   On: 07/11/2014 19:48    Microbiology: No results found for this or any previous visit (from the past 240 hour(s)).   Labs: Basic Metabolic Panel:  Recent Labs Lab 07/11/14 1845 07/13/14 0603  NA 136 139  K 4.0 4.1  CL 102 107  CO2 25 27  GLUCOSE 256* 208*  BUN 14 10  CREATININE 0.78 0.67  CALCIUM 9.7 8.5   Liver Function Tests:  Recent Labs Lab 07/11/14 1850  AST 19  ALT 20  ALKPHOS 106  BILITOT 0.7  PROT 8.4*  ALBUMIN 4.3    Recent Labs Lab 07/11/14 1850  LIPASE 19   No results for input(s): AMMONIA in the last 168 hours. CBC:  Recent Labs Lab 07/11/14 1845 07/12/14 0419 07/13/14 0603 07/14/14 0717  WBC 14.2* 13.9* 11.3* 9.4  NEUTROABS 10.4*  --   --   --   HGB 11.6* 10.2* 10.0* 9.9*  HCT 37.5  35.0* 34.7* 33.7*  MCV 81.7 82.9 85.0 84.3  PLT 479* 468* 440* 399   Cardiac Enzymes:  Recent Labs Lab 07/11/14 1850 07/11/14 2252 07/12/14 0419  TROPONINI <0.03 <0.03 <0.03   BNP: BNP (last 3 results) No results for input(s): BNP in the last 8760 hours.  ProBNP (last 3 results) No results for input(s): PROBNP in the last 8760 hours.  CBG:  Recent Labs Lab 07/13/14 1114 07/13/14 1610 07/13/14 2203 07/14/14 0738 07/14/14 1139  GLUCAP 223* 167* 155* 196* 235*       Signed:  Kehinde Bowdish  [facility] 07/14/2014, 12:46 PM

## 2014-09-12 ENCOUNTER — Emergency Department (HOSPITAL_COMMUNITY): Payer: BLUE CROSS/BLUE SHIELD

## 2014-09-12 ENCOUNTER — Emergency Department (HOSPITAL_COMMUNITY)
Admission: EM | Admit: 2014-09-12 | Discharge: 2014-09-12 | Disposition: A | Discharge status: Home / Self Care | Payer: BLUE CROSS/BLUE SHIELD | Attending: Emergency Medicine | Admitting: Emergency Medicine

## 2014-09-12 ENCOUNTER — Encounter (HOSPITAL_COMMUNITY): Payer: Self-pay | Admitting: Emergency Medicine

## 2014-09-12 DIAGNOSIS — Z87891 Personal history of nicotine dependence: Secondary | ICD-10-CM | POA: Insufficient documentation

## 2014-09-12 DIAGNOSIS — E669 Obesity, unspecified: Secondary | ICD-10-CM | POA: Diagnosis not present

## 2014-09-12 DIAGNOSIS — R112 Nausea with vomiting, unspecified: Secondary | ICD-10-CM

## 2014-09-12 DIAGNOSIS — R1013 Epigastric pain: Secondary | ICD-10-CM | POA: Insufficient documentation

## 2014-09-12 DIAGNOSIS — Z862 Personal history of diseases of the blood and blood-forming organs and certain disorders involving the immune mechanism: Secondary | ICD-10-CM | POA: Insufficient documentation

## 2014-09-12 DIAGNOSIS — Z9889 Other specified postprocedural states: Secondary | ICD-10-CM | POA: Diagnosis not present

## 2014-09-12 DIAGNOSIS — Z79899 Other long term (current) drug therapy: Secondary | ICD-10-CM | POA: Insufficient documentation

## 2014-09-12 DIAGNOSIS — Z90710 Acquired absence of both cervix and uterus: Secondary | ICD-10-CM | POA: Insufficient documentation

## 2014-09-12 DIAGNOSIS — Z8659 Personal history of other mental and behavioral disorders: Secondary | ICD-10-CM | POA: Insufficient documentation

## 2014-09-12 DIAGNOSIS — R079 Chest pain, unspecified: Secondary | ICD-10-CM | POA: Diagnosis present

## 2014-09-12 DIAGNOSIS — E119 Type 2 diabetes mellitus without complications: Secondary | ICD-10-CM | POA: Diagnosis not present

## 2014-09-12 DIAGNOSIS — E785 Hyperlipidemia, unspecified: Secondary | ICD-10-CM | POA: Diagnosis not present

## 2014-09-12 DIAGNOSIS — Z794 Long term (current) use of insulin: Secondary | ICD-10-CM | POA: Diagnosis not present

## 2014-09-12 DIAGNOSIS — Z8541 Personal history of malignant neoplasm of cervix uteri: Secondary | ICD-10-CM | POA: Diagnosis not present

## 2014-09-12 LAB — BASIC METABOLIC PANEL
ANION GAP: 11 (ref 5–15)
BUN: 6 mg/dL (ref 6–20)
CHLORIDE: 102 mmol/L (ref 101–111)
CO2: 27 mmol/L (ref 22–32)
CREATININE: 0.58 mg/dL (ref 0.44–1.00)
Calcium: 9.2 mg/dL (ref 8.9–10.3)
GFR calc Af Amer: >60 mL/min (ref >60)
GFR calc non Af Amer: >60 mL/min (ref >60)
Glucose, Bld: 212 mg/dL — ABNORMAL HIGH (ref 65–99)
Potassium: 3.3 mmol/L — ABNORMAL LOW (ref 3.5–5.1)
Sodium: 140 mmol/L (ref 135–145)

## 2014-09-12 LAB — CBC
HEMATOCRIT: 34 % — AB (ref 36.0–46.0)
Hemoglobin: 9.7 g/dL — ABNORMAL LOW (ref 12.0–15.0)
MCH: 23 pg — ABNORMAL LOW (ref 26.0–34.0)
MCHC: 28.5 g/dL — ABNORMAL LOW (ref 30.0–36.0)
MCV: 80.8 fL (ref 78.0–100.0)
PLATELETS: 406 10*3/uL — AB (ref 150–400)
RBC: 4.21 MIL/uL (ref 3.87–5.11)
RDW: 15.3 % (ref 11.5–15.5)
WBC: 12.2 10*3/uL — ABNORMAL HIGH (ref 4.0–10.5)

## 2014-09-12 LAB — HEPATIC FUNCTION PANEL
ALT: 19 U/L (ref 14–54)
AST: 14 U/L — ABNORMAL LOW (ref 15–41)
Albumin: 3.9 g/dL (ref 3.5–5.0)
Alkaline Phosphatase: 97 U/L (ref 38–126)
BILIRUBIN DIRECT: 0.1 mg/dL (ref 0.1–0.5)
BILIRUBIN INDIRECT: 0.8 mg/dL (ref 0.3–0.9)
TOTAL PROTEIN: 8.2 g/dL — AB (ref 6.5–8.1)
Total Bilirubin: 0.9 mg/dL (ref 0.3–1.2)

## 2014-09-12 LAB — TROPONIN I: Troponin I: <0.03 ng/mL (ref <0.031)

## 2014-09-12 LAB — LIPASE, BLOOD: Lipase: 19 U/L — ABNORMAL LOW (ref 22–51)

## 2014-09-12 MED ORDER — FAMOTIDINE 20 MG PO TABS
20.0000 mg | ORAL_TABLET | Freq: Once | ORAL | Status: AC
  Administered 2014-09-12: 20 mg via ORAL
  Filled 2014-09-12: qty 1

## 2014-09-12 MED ORDER — ONDANSETRON HCL 4 MG/2ML IJ SOLN
4.0000 mg | Freq: Once | INTRAMUSCULAR | Status: AC
  Administered 2014-09-12: 4 mg via INTRAVENOUS
  Filled 2014-09-12: qty 2

## 2014-09-12 MED ORDER — METOCLOPRAMIDE HCL 5 MG/ML IJ SOLN
10.0000 mg | Freq: Once | INTRAMUSCULAR | Status: AC
  Administered 2014-09-12: 10 mg via INTRAVENOUS
  Filled 2014-09-12: qty 2

## 2014-09-12 MED ORDER — HYDROMORPHONE HCL 1 MG/ML IJ SOLN
1.0000 mg | Freq: Once | INTRAMUSCULAR | Status: AC
  Administered 2014-09-12: 1 mg via INTRAVENOUS
  Filled 2014-09-12: qty 1

## 2014-09-12 MED ORDER — SODIUM CHLORIDE 0.9 % IV BOLUS (SEPSIS)
500.0000 mL | Freq: Once | INTRAVENOUS | Status: AC
Start: 2014-09-12 — End: 2014-09-12
  Administered 2014-09-12: 500 mL via INTRAVENOUS

## 2014-09-12 MED ORDER — HYDROMORPHONE HCL 1 MG/ML IJ SOLN
1.0000 mg | Freq: Once | INTRAMUSCULAR | Status: AC
Start: 2014-09-12 — End: 2014-09-12
  Administered 2014-09-12: 1 mg via INTRAVENOUS
  Filled 2014-09-12: qty 1

## 2014-09-12 MED ORDER — POTASSIUM CHLORIDE CRYS ER 20 MEQ PO TBCR
20.0000 meq | EXTENDED_RELEASE_TABLET | Freq: Once | ORAL | Status: AC
  Administered 2014-09-12: 20 meq via ORAL
  Filled 2014-09-12: qty 1

## 2014-09-12 MED ORDER — LORAZEPAM 2 MG/ML IJ SOLN
1.0000 mg | Freq: Once | INTRAMUSCULAR | Status: AC
  Administered 2014-09-12: 1 mg via INTRAVENOUS
  Filled 2014-09-12: qty 1

## 2014-09-12 MED ORDER — GI COCKTAIL ~~LOC~~
30.0000 mL | Freq: Once | ORAL | Status: AC
  Administered 2014-09-12: 30 mL via ORAL
  Filled 2014-09-12: qty 30

## 2014-09-12 MED ORDER — PANTOPRAZOLE SODIUM 40 MG IV SOLR
40.0000 mg | Freq: Once | INTRAVENOUS | Status: AC
  Administered 2014-09-12: 40 mg via INTRAVENOUS
  Filled 2014-09-12: qty 40

## 2014-09-12 MED ORDER — HYDROCODONE-ACETAMINOPHEN 5-325 MG PO TABS: 1.0000 | ORAL_TABLET | Freq: Four times a day (QID) | ORAL | Status: DC | PRN

## 2014-09-12 NOTE — ED Provider Notes (Signed)
CSN: [id number]     Arrival date & time 09/12/14  1729 History   First MD Initiated Contact with Patient 09/12/14 1806     Chief Complaint  Patient presents with  . Chest Pain     (Consider location/radiation/quality/duration/timing/severity/associated sxs/prior Treatment) Patient is a 44 y.o. female presenting with chest pain. The history is provided by the patient.  Chest Pain Associated symptoms: abdominal pain, nausea and vomiting   Associated symptoms: no back pain, no cough, no fever, no headache and no shortness of breath   Patient with hx iddm, gastroparesis, recurrent nv, presents c/o epigastric pain and nv onset 1-2 days ago. States several episodes emesis, not bloody or bilious. No abd distension. Having normal bms. Pt states epigastric pain moves up into chest. Pain constant for the past day. At rest. No specific exacerbating or alleviating factors. No pleuritic pain. No associated sob, or diaphoresis. No leg pain or swelling. No recent surgery, immobility, trauma, or travel. +fam hx premature cad/parent. No other recent cp or discomfort. No exertional cp or unusual doe or fatigue. Pt denies cough or uri c/o. No fever or chills. No back or flank pain. Denies hx pud, gallstones, or pancreatitis. Prior abd surgery includes remote hx hysterectomy, and ventral hernia repair.      Past Medical History  Diagnosis Date  . Chest pain syndrome   . Sinus tachycardia     Persistent  . Microcytic anemia   . DM (diabetes mellitus)   . HTN (hypertension)   . HLD (hyperlipidemia)   . Morbid obesity   . Anxiety   . Cervical cancer   . Diabetic gastroparesis     Diagnosed 2014.  Marland Kitchen Normocytic anemia   . Mesenteric adenitis    Past Surgical History  Procedure Laterality Date  . Hernia repair    . Partial hysterectomy    . Tubal ligation     Family History  Problem Relation Age of Onset  . Heart attack Father   . Heart attack Brother 36   History  Substance Use Topics  .  Smoking status: Former Smoker -- 0.25 packs/day for 2 years    Types: Cigarettes  . Smokeless tobacco: Never Used  . Alcohol Use: Yes     Comment: rarely   OB History    Gravida Para Term Preterm AB TAB SAB Ectopic Multiple Living   3 3 3       3      Review of Systems  Constitutional: Negative for fever and chills.  HENT: Negative for sore throat.   Eyes: Negative for redness.  Respiratory: Negative for cough and shortness of breath.   Cardiovascular: Positive for chest pain. Negative for leg swelling.  Gastrointestinal: Positive for nausea, vomiting and abdominal pain. Negative for diarrhea and abdominal distention.  Endocrine: Negative for polyuria.  Genitourinary: Negative for dysuria, hematuria and flank pain.  Musculoskeletal: Negative for back pain and neck pain.  Skin: Negative for rash.  Neurological: Negative for headaches.  Hematological: Does not bruise/bleed easily.  Psychiatric/Behavioral: Negative for confusion.      Allergies  Review of patient's allergies indicates no known allergies.  Home Medications   Prior to Admission medications   Medication Sig Start Date End Date Taking? Authorizing Provider  amLODipine (NORVASC) 10 MG tablet Take 10 mg by mouth daily.    Historical Provider, MD  Insulin Glargine (LANTUS) 100 UNIT/ML Solostar Pen Inject 20 Units into the skin daily at 10 pm. 07/14/14   Rexene Alberts, MD  metoCLOPramide (REGLAN) 10 MG tablet Take 10 mg by mouth 4 (four) times daily.    Historical Provider, MD  metoprolol (LOPRESSOR) 100 MG tablet Take 100 mg by mouth 2 (two) times daily.    Historical Provider, MD  metroNIDAZOLE (FLAGYL) 250 MG tablet Take 1 tablet (250 mg total) by mouth 3 (three) times daily. Take for 7 more days 07/14/14   Rexene Alberts, MD  oxyCODONE-acetaminophen (PERCOCET) 7.5-325 MG per tablet Take 1 tablet by mouth every 4 (four) hours as needed for pain. 07/14/14   Rexene Alberts, MD  pantoprazole (PROTONIX) 40 MG tablet Take 40 mg  by mouth daily.    Historical Provider, MD  promethazine (PHENERGAN) 25 MG tablet Take 0.5 tablets (12.5 mg total) by mouth every 6 (six) hours as needed for nausea or vomiting. 07/14/14   Rexene Alberts, MD  sitaGLIPtin (JANUVIA) 100 MG tablet Take 100 mg by mouth daily.    Historical Provider, MD   BP 184/111 mmHg  Pulse 95  Temp(Src) 98.3 F (36.8 C) (Oral)  Resp 17  Ht 5\' 5"  (1.651 m)  Wt 320 lb (145.151 kg)  BMI 53.25 kg/m2  SpO2 98% Physical Exam  Constitutional: She appears well-developed and well-nourished. No distress.  HENT:  Mouth/Throat: Oropharynx is clear and moist.  Eyes: Conjunctivae are normal. No scleral icterus.  Neck: Neck supple. No tracheal deviation present.  Cardiovascular: Normal rate, regular rhythm, normal heart sounds and intact distal pulses.  Exam reveals no gallop and no friction rub.   No murmur heard. Pulmonary/Chest: Effort normal and breath sounds normal. No respiratory distress. She exhibits no tenderness.  Abdominal: Soft. Normal appearance and bowel sounds are normal. She exhibits no distension and no mass. There is tenderness. There is no rebound and no guarding.  Epigastric tenderness. No incarc hernia.   Genitourinary:  No cva tenderness.   Musculoskeletal: She exhibits no edema or tenderness.  Neurological: She is alert.  Skin: Skin is warm and dry. No rash noted. She is not diaphoretic.  Psychiatric: She has a normal mood and affect.  Nursing note and vitals reviewed.   ED Course  Procedures (including critical care time) Labs Review  Results for orders placed or performed during the hospital encounter of 09/12/14  CBC  Result Value Ref Range   WBC 12.2 (H) 4.0 - 10.5 K/uL   RBC 4.21 3.87 - 5.11 MIL/uL   Hemoglobin 9.7 (L) 12.0 - 15.0 g/dL   HCT 34.0 (L) 36.0 - 46.0 %   MCV 80.8 78.0 - 100.0 fL   MCH 23.0 (L) 26.0 - 34.0 pg   MCHC 28.5 (L) 30.0 - 36.0 g/dL   RDW 15.3 11.5 - 15.5 %   Platelets 406 (H) 150 - 400 K/uL  Basic  metabolic panel  Result Value Ref Range   Sodium 140 135 - 145 mmol/L   Potassium 3.3 (L) 3.5 - 5.1 mmol/L   Chloride 102 101 - 111 mmol/L   CO2 27 22 - 32 mmol/L   Glucose, Bld 212 (H) 65 - 99 mg/dL   BUN 6 6 - 20 mg/dL   Creatinine, Ser 0.58 0.44 - 1.00 mg/dL   Calcium 9.2 8.9 - 10.3 mg/dL   GFR calc non Af Amer >60 >60 mL/min   GFR calc Af Amer >60 >60 mL/min   Anion gap 11 5 - 15  Troponin I  Result Value Ref Range   Troponin I <0.03 <0.031 ng/mL  Hepatic function panel  Result Value Ref Range  Total Protein 8.2 (H) 6.5 - 8.1 g/dL   Albumin 3.9 3.5 - 5.0 g/dL   AST 14 (L) 15 - 41 U/L   ALT 19 14 - 54 U/L   Alkaline Phosphatase 97 38 - 126 U/L   Total Bilirubin 0.9 0.3 - 1.2 mg/dL   Bilirubin, Direct 0.1 0.1 - 0.5 mg/dL   Indirect Bilirubin 0.8 0.3 - 0.9 mg/dL  Lipase, blood  Result Value Ref Range   Lipase 19 (L) 22 - 51 U/L   Dg Chest Port 1 View  09/12/2014   CLINICAL DATA:  Left-sided chest pain for 2 days.  EXAM: PORTABLE CHEST - 1 VIEW  COMPARISON:  07/11/2014 and prior exams  FINDINGS: This is a low volume film.  The cardiomediastinal silhouette is unchanged.  There is no evidence of focal airspace disease, pulmonary edema, suspicious pulmonary nodule/mass, pleural effusion, or pneumothorax. No acute bony abnormalities are identified.  IMPRESSION: No active disease.   Electronically Signed   By: Margarette Canada M.D.   On: 09/12/2014 18:32         EKG Interpretation   Date/Time:  Tuesday Sep 12 2014 17:53:17 EDT Ventricular Rate:  96 PR Interval:  119 QRS Duration: 93 QT Interval:  386 QTC Calculation: 488 R Axis:   71 Text Interpretation:  Sinus rhythm Borderline prolonged QT interval No  significant change since last tracing Confirmed by Ashok Cordia  MD, Lennette Bihari  ([id number]) on 09/12/2014 6:07:02 PM      MDM   Iv ns bolus. reglan iv. Dilaudid 1 mg iv. protonix iv. Labs.  Reviewed nursing notes and prior charts for additional history.   Pt notes transient relief  w pain meds and reglan.  Recheck pt appear anxious. Discussed labs, reassuring results. Ativan 1 mg iv for anxiety.  zofran for persistent nausea. pepcid and gi cocktail also tried for symptom relief.  After constant symptoms for past 1-2 days, trop neg.  Other abd labs also unremarkable.   Recheck abd soft, mild epigastric tenderness.   kcl po, po fluids.  Delta trop neg.   Pt tolerating po.    Recheck hr 94, rr 16 pulse ox 95%, afeb.  Recheck abd soft nt.   Pt currently appears stable for d/c. Rec close pcp f/u tomorrow.  Return precautions provided.       Lajean Saver, MD 09/12/14 2158

## 2014-09-12 NOTE — ED Notes (Signed)
Dr Steinl at bedside,  

## 2014-09-12 NOTE — Discharge Instructions (Signed)
It was our pleasure to provide your ER care today - we hope that you feel better.  Rest. Drink plenty of fluids.  Continue protonix.  Take reglan as prescribed.  You may also take hydrocodone as need for pain. No driving when taking hydrocodone. Also, do not take tylenol or acetaminophen containing medication when taking hydrocodone.  From today's labs, your potassium level is mildly low (3.3) - eat plenty of fruits and vegetables, and have level rechecked by your doctor in 1-2 weeks.  You also have persistent microcytic anemia - take iron supplement, and follow up with primary care doctor.  Also follow up closely with your doctor for blood pressure recheck as it was high today.   For abdominal pain and vomiting, follow up with your doctor tomorrow for recheck.  Return to ER if worse, new symptoms, fevers, persistent vomiting, worsening or severe abdominal pain, recurrent or persistent chest pain, trouble breathing, other concern.  You were given pain medication in the ER - no driving for the next 4 hours.     Abdominal Pain Many things can cause abdominal pain. Usually, abdominal pain is not caused by a disease and will improve without treatment. It can often be observed and treated at home. Your health care provider will do a physical exam and possibly order blood tests and X-rays to help determine the seriousness of your pain. However, in many cases, more time must pass before a clear cause of the pain can be found. Before that point, your health care provider may not know if you need more testing or further treatment. HOME CARE INSTRUCTIONS  Monitor your abdominal pain for any changes. The following actions may help to alleviate any discomfort you are experiencing:  Only take over-the-counter or prescription medicines as directed by your health care provider.  Do not take laxatives unless directed to do so by your health care provider.  Try a clear liquid diet (broth, tea, or  water) as directed by your health care provider. Slowly move to a bland diet as tolerated. SEEK MEDICAL CARE IF:  You have unexplained abdominal pain.  You have abdominal pain associated with nausea or diarrhea.  You have pain when you urinate or have a bowel movement.  You experience abdominal pain that wakes you in the night.  You have abdominal pain that is worsened or improved by eating food.  You have abdominal pain that is worsened with eating fatty foods.  You have a fever. SEEK IMMEDIATE MEDICAL CARE IF:   Your pain does not go away within 2 hours.  You keep throwing up (vomiting).  Your pain is felt only in portions of the abdomen, such as the right side or the left lower portion of the abdomen.  You pass bloody or black tarry stools. MAKE SURE YOU:  Understand these instructions.   Will watch your condition.   Will get help right away if you are not doing well or get worse.  Document Released: 01/08/2005 Document Revised: 04/05/2013 Document Reviewed: 12/08/2012 [employer] Patient Information 2015 [place], [place]. This information is not intended to replace advice given to you by your health care provider. Make sure you discuss any questions you have with your health care provider.    Nausea and Vomiting Nausea is a sick feeling that often comes before throwing up (vomiting). Vomiting is a reflex where stomach contents come out of your mouth. Vomiting can cause severe loss of body fluids (dehydration). Children and elderly adults can become dehydrated quickly, especially  if they also have diarrhea. Nausea and vomiting are symptoms of a condition or disease. It is important to find the cause of your symptoms. CAUSES   Direct irritation of the stomach lining. This irritation can result from increased acid production (gastroesophageal reflux disease), infection, food poisoning, taking certain medicines (such as nonsteroidal anti-inflammatory drugs), alcohol use, or  tobacco use.  Signals from the brain.These signals could be caused by a headache, heat exposure, an inner ear disturbance, increased pressure in the brain from injury, infection, a tumor, or a concussion, pain, emotional stimulus, or metabolic problems.  An obstruction in the gastrointestinal tract (bowel obstruction).  Illnesses such as diabetes, hepatitis, gallbladder problems, appendicitis, kidney problems, cancer, sepsis, atypical symptoms of a heart attack, or eating disorders.  Medical treatments such as chemotherapy and radiation.  Receiving medicine that makes you sleep (general anesthetic) during surgery. DIAGNOSIS Your caregiver may ask for tests to be done if the problems do not improve after a few days. Tests may also be done if symptoms are severe or if the reason for the nausea and vomiting is not clear. Tests may include:  Urine tests.  Blood tests.  Stool tests.  Cultures (to look for evidence of infection).  X-rays or other imaging studies. Test results can help your caregiver make decisions about treatment or the need for additional tests. TREATMENT You need to stay well hydrated. Drink frequently but in small amounts.You may wish to drink water, sports drinks, clear broth, or eat frozen ice pops or gelatin dessert to help stay hydrated.When you eat, eating slowly may help prevent nausea.There are also some antinausea medicines that may help prevent nausea. HOME CARE INSTRUCTIONS   Take all medicine as directed by your caregiver.  If you do not have an appetite, do not force yourself to eat. However, you must continue to drink fluids.  If you have an appetite, eat a normal diet unless your caregiver tells you differently.  Eat a variety of complex carbohydrates (rice, wheat, potatoes, bread), lean meats, yogurt, fruits, and vegetables.  Avoid high-fat foods because they are more difficult to digest.  Drink enough water and fluids to keep your urine clear or  pale yellow.  If you are dehydrated, ask your caregiver for specific rehydration instructions. Signs of dehydration may include:  Severe thirst.  Dry lips and mouth.  Dizziness.  Dark urine.  Decreasing urine frequency and amount.  Confusion.  Rapid breathing or pulse. SEEK IMMEDIATE MEDICAL CARE IF:   You have blood or brown flecks (like coffee grounds) in your vomit.  You have black or bloody stools.  You have a severe headache or stiff neck.  You are confused.  You have severe abdominal pain.  You have chest pain or trouble breathing.  You do not urinate at least once every 8 hours.  You develop cold or clammy skin.  You continue to vomit for longer than 24 to 48 hours.  You have a fever. MAKE SURE YOU:   Understand these instructions.  Will watch your condition.  Will get help right away if you are not doing well or get worse. Document Released: 03/31/2005 Document Revised: 06/23/2011 Document Reviewed: 08/28/2010 [facility] Patient Information 2015 [place], [place]. This information is not intended to replace advice given to you by your health care provider. Make sure you discuss any questions you have with your health care provider.     Hypokalemia Hypokalemia means that the amount of potassium in the blood is lower than normal.Potassium is  a chemical, called an electrolyte, that helps regulate the amount of fluid in the body. It also stimulates muscle contraction and helps nerves function properly.Most of the body's potassium is inside of cells, and only a very small amount is in the blood. Because the amount in the blood is so small, minor changes can be life-threatening. CAUSES  Antibiotics.  Diarrhea or vomiting.  Using laxatives too much, which can cause diarrhea.  Chronic kidney disease.  Water pills (diuretics).  Eating disorders (bulimia).  Low magnesium level.  Sweating a lot. SIGNS AND  SYMPTOMS  Weakness.  Constipation.  Fatigue.  Muscle cramps.  Mental confusion.  Skipped heartbeats or irregular heartbeat (palpitations).  Tingling or numbness. DIAGNOSIS  Your health care provider can diagnose hypokalemia with blood tests. In addition to checking your potassium level, your health care provider may also check other lab tests. TREATMENT Hypokalemia can be treated with potassium supplements taken by mouth or adjustments in your current medicines. If your potassium level is very low, you may need to get potassium through a vein (IV) and be monitored in the hospital. A diet high in potassium is also helpful. Foods high in potassium are:  Nuts, such as peanuts and pistachios.  Seeds, such as sunflower seeds and pumpkin seeds.  Peas, lentils, and lima beans.  Whole grain and bran cereals and breads.  Fresh fruit and vegetables, such as apricots, avocado, bananas, cantaloupe, kiwi, oranges, tomatoes, asparagus, and potatoes.  Orange and tomato juices.  Red meats.  Fruit yogurt. HOME CARE INSTRUCTIONS  Take all medicines as prescribed by your health care provider.  Maintain a healthy diet by including nutritious food, such as fruits, vegetables, nuts, whole grains, and lean meats.  If you are taking a laxative, be sure to follow the directions on the label. SEEK MEDICAL CARE IF:  Your weakness gets worse.  You feel your heart pounding or racing.  You are vomiting or having diarrhea.  You are diabetic and having trouble keeping your blood glucose in the normal range. SEEK IMMEDIATE MEDICAL CARE IF:  You have chest pain, shortness of breath, or dizziness.  You are vomiting or having diarrhea for more than 2 days.  You faint. MAKE SURE YOU:   Understand these instructions.  Will watch your condition.  Will get help right away if you are not doing well or get worse. Document Released: 03/31/2005 Document Revised: 01/19/2013 Document Reviewed:  10/01/2012 [facility] Patient Information 2015 [place], [place]. This information is not intended to replace advice given to you by your health care provider. Make sure you discuss any questions you have with your health care provider.    Iron Deficiency Anemia Anemia is a condition in which there are less red blood cells or hemoglobin in the blood than normal. Hemoglobin is the part of red blood cells that carries oxygen. Iron deficiency anemia is anemia caused by too little iron. It is the most common type of anemia. It may leave you tired and short of breath. CAUSES   Lack of iron in the diet.  Poor absorption of iron, as seen with intestinal disorders.  Intestinal bleeding.  Heavy periods. SIGNS AND SYMPTOMS  Mild anemia may not be noticeable. Symptoms may include:  Fatigue.  Headache.  Pale skin.  Weakness.  Tiredness.  Shortness of breath.  Dizziness.  Cold hands and feet.  Fast or irregular heartbeat. DIAGNOSIS  Diagnosis requires a thorough evaluation and physical exam by your health care provider. Blood tests are generally used to  confirm iron deficiency anemia. Additional tests may be done to find the underlying cause of your anemia. These may include:  Testing for blood in the stool (fecal occult blood test).  A procedure to see inside the colon and rectum (colonoscopy).  A procedure to see inside the esophagus and stomach (endoscopy). TREATMENT  Iron deficiency anemia is treated by correcting the cause of the deficiency. Treatment may involve:  Adding iron-rich foods to your diet.  Taking iron supplements. Pregnant or breastfeeding women need to take extra iron because their normal diet usually does not provide the required amount.  Taking vitamins. Vitamin C improves the absorption of iron. Your health care provider may recommend that you take your iron tablets with a glass of orange juice or vitamin C supplement.  Medicines to make heavy menstrual flow  lighter.  Surgery. HOME CARE INSTRUCTIONS   Take iron as directed by your health care provider.  If you cannot tolerate taking iron supplements by mouth, talk to your health care provider about taking them through a vein (intravenously) or an injection into a muscle.  For the best iron absorption, iron supplements should be taken on an empty stomach. If you cannot tolerate them on an empty stomach, you may need to take them with food.  Do not drink milk or take antacids at the same time as your iron supplements. Milk and antacids may interfere with the absorption of iron.  Iron supplements can cause constipation. Make sure to include fiber in your diet to prevent constipation. A stool softener may also be recommended.  Take vitamins as directed by your health care provider.  Eat a diet rich in iron. Foods high in iron include liver, lean beef, whole-grain bread, eggs, dried fruit, and dark green leafy vegetables. SEEK IMMEDIATE MEDICAL CARE IF:   You faint. If this happens, do not drive. Call your local emergency services (911 in [place] if no other help is available.  You have chest pain.  You feel nauseous or vomit.  You have severe or increased shortness of breath with activity.  You feel weak.  You have a rapid heartbeat.  You have unexplained sweating.  You become light-headed when getting up from a chair or bed. MAKE SURE YOU:   Understand these instructions.  Will watch your condition.  Will get help right away if you are not doing well or get worse. Document Released: 03/28/2000 Document Revised: 04/05/2013 Document Reviewed: 12/06/2012 [facility] Patient Information 2015 [place], [place]. This information is not intended to replace advice given to you by your health care provider. Make sure you discuss any questions you have with your health care provider.    Chest Pain (Nonspecific) It is often hard to give a specific diagnosis for the cause of chest pain. There  is always a chance that your pain could be related to something serious, such as a heart attack or a blood clot in the lungs. You need to follow up with your health care provider for further evaluation. CAUSES   Heartburn.  Pneumonia or bronchitis.  Anxiety or stress.  Inflammation around your heart (pericarditis) or lung (pleuritis or pleurisy).  A blood clot in the lung.  A collapsed lung (pneumothorax). It can develop suddenly on its own (spontaneous pneumothorax) or from trauma to the chest.  Shingles infection (herpes zoster virus). The chest wall is composed of bones, muscles, and cartilage. Any of these can be the source of the pain.  The bones can be bruised by injury.  The muscles or cartilage can be strained by coughing or overwork.  The cartilage can be affected by inflammation and become sore (costochondritis). DIAGNOSIS  Lab tests or other studies may be needed to find the cause of your pain. Your health care provider may have you take a test called an ambulatory electrocardiogram (ECG). An ECG records your heartbeat patterns over a 24-hour period. You may also have other tests, such as:  Transthoracic echocardiogram (TTE). During echocardiography, sound waves are used to evaluate how blood flows through your heart.  Transesophageal echocardiogram (TEE).  Cardiac monitoring. This allows your health care provider to monitor your heart rate and rhythm in real time.  Holter monitor. This is a portable device that records your heartbeat and can help diagnose heart arrhythmias. It allows your health care provider to track your heart activity for several days, if needed.  Stress tests by exercise or by giving medicine that makes the heart beat faster. TREATMENT   Treatment depends on what may be causing your chest pain. Treatment may include:  Acid blockers for heartburn.  Anti-inflammatory medicine.  Pain medicine for inflammatory conditions.  Antibiotics if an  infection is present.  You may be advised to change lifestyle habits. This includes stopping smoking and avoiding alcohol, caffeine, and chocolate.  You may be advised to keep your head raised (elevated) when sleeping. This reduces the chance of acid going backward from your stomach into your esophagus. Most of the time, nonspecific chest pain will improve within 2-3 days with rest and mild pain medicine.  HOME CARE INSTRUCTIONS   If antibiotics were prescribed, take them as directed. Finish them even if you start to feel better.  For the next few days, avoid physical activities that bring on chest pain. Continue physical activities as directed.  Do not use any tobacco products, including cigarettes, chewing tobacco, or electronic cigarettes.  Avoid drinking alcohol.  Only take medicine as directed by your health care provider.  Follow your health care provider's suggestions for further testing if your chest pain does not go away.  Keep any follow-up appointments you made. If you do not go to an appointment, you could develop lasting (chronic) problems with pain. If there is any problem keeping an appointment, call to reschedule. SEEK MEDICAL CARE IF:   Your chest pain does not go away, even after treatment.  You have a rash with blisters on your chest.  You have a fever. SEEK IMMEDIATE MEDICAL CARE IF:   You have increased chest pain or pain that spreads to your arm, neck, jaw, back, or abdomen.  You have shortness of breath.  You have an increasing cough, or you cough up blood.  You have severe back or abdominal pain.  You feel nauseous or vomit.  You have severe weakness.  You faint.  You have chills. This is an emergency. Do not wait to see if the pain will go away. Get medical help at once. Call your local emergency services (911 in [place] Do not drive yourself to the hospital. MAKE SURE YOU:   Understand these instructions.  Will watch your condition.  Will  get help right away if you are not doing well or get worse. Document Released: 01/08/2005 Document Revised: 04/05/2013 Document Reviewed: 11/04/2007 [facility] Patient Information 2015 [place], [place]. This information is not intended to replace advice given to you by your health care provider. Make sure you discuss any questions you have with your health care provider.    Hypertension Hypertension, commonly  called high blood pressure, is when the force of blood pumping through your arteries is too strong. Your arteries are the blood vessels that carry blood from your heart throughout your body. A blood pressure reading consists of a higher number over a lower number, such as 110/72. The higher number (systolic) is the pressure inside your arteries when your heart pumps. The lower number (diastolic) is the pressure inside your arteries when your heart relaxes. Ideally you want your blood pressure below 120/80. Hypertension forces your heart to work harder to pump blood. Your arteries may become narrow or stiff. Having hypertension puts you at risk for heart disease, stroke, and other problems.  RISK FACTORS Some risk factors for high blood pressure are controllable. Others are not.  Risk factors you cannot control include:   Race. You may be at higher risk if you are African American.  Age. Risk increases with age.  Gender. Men are at higher risk than women before age 1 years. After age 33, women are at higher risk than men. Risk factors you can control include:  Not getting enough exercise or physical activity.  Being overweight.  Getting too much fat, sugar, calories, or salt in your diet.  Drinking too much alcohol. SIGNS AND SYMPTOMS Hypertension does not usually cause signs or symptoms. Extremely high blood pressure (hypertensive crisis) may cause headache, anxiety, shortness of breath, and nosebleed. DIAGNOSIS  To check if you have hypertension, your health care provider will  measure your blood pressure while you are seated, with your arm held at the level of your heart. It should be measured at least twice using the same arm. Certain conditions can cause a difference in blood pressure between your right and left arms. A blood pressure reading that is higher than normal on one occasion does not mean that you need treatment. If one blood pressure reading is high, ask your health care provider about having it checked again. TREATMENT  Treating high blood pressure includes making lifestyle changes and possibly taking medicine. Living a healthy lifestyle can help lower high blood pressure. You may need to change some of your habits. Lifestyle changes may include:  Following the DASH diet. This diet is high in fruits, vegetables, and whole grains. It is low in salt, red meat, and added sugars.  Getting at least 2 hours of brisk physical activity every week.  Losing weight if necessary.  Not smoking.  Limiting alcoholic beverages.  Learning ways to reduce stress. If lifestyle changes are not enough to get your blood pressure under control, your health care provider may prescribe medicine. You may need to take more than one. Work closely with your health care provider to understand the risks and benefits. HOME CARE INSTRUCTIONS  Have your blood pressure rechecked as directed by your health care provider.   Take medicines only as directed by your health care provider. Follow the directions carefully. Blood pressure medicines must be taken as prescribed. The medicine does not work as well when you skip doses. Skipping doses also puts you at risk for problems.   Do not smoke.   Monitor your blood pressure at home as directed by your health care provider. SEEK MEDICAL CARE IF:   You think you are having a reaction to medicines taken.  You have recurrent headaches or feel dizzy.  You have swelling in your ankles.  You have trouble with your vision. SEEK  IMMEDIATE MEDICAL CARE IF:  You develop a severe headache or confusion.  You  have unusual weakness, numbness, or feel faint.  You have severe chest or abdominal pain.  You vomit repeatedly.  You have trouble breathing. MAKE SURE YOU:   Understand these instructions.  Will watch your condition.  Will get help right away if you are not doing well or get worse. Document Released: 03/31/2005 Document Revised: 08/15/2013 Document Reviewed: 01/21/2013 [facility] Patient Information 2015 [place], [place]. This information is not intended to replace advice given to you by your health care provider. Make sure you discuss any questions you have with your health care provider.

## 2014-09-12 NOTE — ED Notes (Signed)
MD at bedside. 

## 2014-09-12 NOTE — ED Notes (Signed)
Abdominal pain x 2days, vomiting and diarrhea, today pain radiating up into chest

## 2014-09-12 NOTE — ED Notes (Signed)
Pt reports that the medication has not helped with the pain, Dr Ashok Cordia notified, no additional orders received,

## 2014-10-03 ENCOUNTER — Emergency Department (HOSPITAL_COMMUNITY): Payer: BLUE CROSS/BLUE SHIELD

## 2014-10-03 ENCOUNTER — Emergency Department (HOSPITAL_COMMUNITY)
Admission: EM | Admit: 2014-10-03 | Discharge: 2014-10-03 | Disposition: A | Discharge status: Home / Self Care | Payer: BLUE CROSS/BLUE SHIELD | Attending: Emergency Medicine | Admitting: Emergency Medicine

## 2014-10-03 ENCOUNTER — Encounter (HOSPITAL_COMMUNITY): Payer: Self-pay

## 2014-10-03 DIAGNOSIS — E1143 Type 2 diabetes mellitus with diabetic autonomic (poly)neuropathy: Secondary | ICD-10-CM | POA: Insufficient documentation

## 2014-10-03 DIAGNOSIS — R1013 Epigastric pain: Secondary | ICD-10-CM | POA: Diagnosis not present

## 2014-10-03 DIAGNOSIS — I471 Supraventricular tachycardia: Secondary | ICD-10-CM | POA: Insufficient documentation

## 2014-10-03 DIAGNOSIS — Z8659 Personal history of other mental and behavioral disorders: Secondary | ICD-10-CM | POA: Insufficient documentation

## 2014-10-03 DIAGNOSIS — Z862 Personal history of diseases of the blood and blood-forming organs and certain disorders involving the immune mechanism: Secondary | ICD-10-CM | POA: Insufficient documentation

## 2014-10-03 DIAGNOSIS — Z87891 Personal history of nicotine dependence: Secondary | ICD-10-CM | POA: Diagnosis not present

## 2014-10-03 DIAGNOSIS — R079 Chest pain, unspecified: Secondary | ICD-10-CM | POA: Diagnosis present

## 2014-10-03 DIAGNOSIS — Z8541 Personal history of malignant neoplasm of cervix uteri: Secondary | ICD-10-CM | POA: Diagnosis not present

## 2014-10-03 DIAGNOSIS — I10 Essential (primary) hypertension: Secondary | ICD-10-CM | POA: Diagnosis not present

## 2014-10-03 DIAGNOSIS — Z794 Long term (current) use of insulin: Secondary | ICD-10-CM | POA: Insufficient documentation

## 2014-10-03 DIAGNOSIS — Z79899 Other long term (current) drug therapy: Secondary | ICD-10-CM | POA: Diagnosis not present

## 2014-10-03 LAB — CBC WITH DIFFERENTIAL/PLATELET
Basophils Absolute: 0 10*3/uL (ref 0.0–0.1)
Basophils Relative: 0 % (ref 0–1)
Eosinophils Absolute: 0.4 10*3/uL (ref 0.0–0.7)
Eosinophils Relative: 3 % (ref 0–5)
HEMATOCRIT: 32 % — AB (ref 36.0–46.0)
Hemoglobin: 9.1 g/dL — ABNORMAL LOW (ref 12.0–15.0)
LYMPHS PCT: 23 % (ref 12–46)
Lymphs Abs: 3.3 10*3/uL (ref 0.7–4.0)
MCH: 22.1 pg — ABNORMAL LOW (ref 26.0–34.0)
MCHC: 28.4 g/dL — ABNORMAL LOW (ref 30.0–36.0)
MCV: 77.9 fL — ABNORMAL LOW (ref 78.0–100.0)
MONOS PCT: 4 % (ref 3–12)
Monocytes Absolute: 0.6 10*3/uL (ref 0.1–1.0)
Neutro Abs: 9.7 10*3/uL — ABNORMAL HIGH (ref 1.7–7.7)
Neutrophils Relative %: 70 % (ref 43–77)
Platelets: 404 10*3/uL — ABNORMAL HIGH (ref 150–400)
RBC: 4.11 MIL/uL (ref 3.87–5.11)
RDW: 14.8 % (ref 11.5–15.5)
WBC: 14 10*3/uL — ABNORMAL HIGH (ref 4.0–10.5)

## 2014-10-03 LAB — COMPREHENSIVE METABOLIC PANEL
ALBUMIN: 3.8 g/dL (ref 3.5–5.0)
ALT: 24 U/L (ref 14–54)
ANION GAP: 8 (ref 5–15)
AST: 18 U/L (ref 15–41)
Alkaline Phosphatase: 94 U/L (ref 38–126)
BILIRUBIN TOTAL: 0.5 mg/dL (ref 0.3–1.2)
BUN: 13 mg/dL (ref 6–20)
CHLORIDE: 103 mmol/L (ref 101–111)
CO2: 26 mmol/L (ref 22–32)
CREATININE: 0.85 mg/dL (ref 0.44–1.00)
Calcium: 8.9 mg/dL (ref 8.9–10.3)
GFR calc Af Amer: >60 mL/min (ref >60)
GFR calc non Af Amer: >60 mL/min (ref >60)
Glucose, Bld: 187 mg/dL — ABNORMAL HIGH (ref 65–99)
POTASSIUM: 3.6 mmol/L (ref 3.5–5.1)
SODIUM: 137 mmol/L (ref 135–145)
Total Protein: 7.9 g/dL (ref 6.5–8.1)

## 2014-10-03 LAB — LIPASE, BLOOD: Lipase: 18 U/L — ABNORMAL LOW (ref 22–51)

## 2014-10-03 LAB — TROPONIN I: Troponin I: <0.03 ng/mL (ref <0.031)

## 2014-10-03 MED ORDER — SODIUM CHLORIDE 0.9 % IV BOLUS (SEPSIS)
1000.0000 mL | Freq: Once | INTRAVENOUS | Status: AC
  Administered 2014-10-03: 1000 mL via INTRAVENOUS

## 2014-10-03 MED ORDER — HYDROMORPHONE HCL 1 MG/ML IJ SOLN
1.0000 mg | Freq: Once | INTRAMUSCULAR | Status: AC
  Administered 2014-10-03: 1 mg via INTRAVENOUS
  Filled 2014-10-03: qty 1

## 2014-10-03 MED ORDER — FENTANYL CITRATE (PF) 100 MCG/2ML IJ SOLN
50.0000 ug | Freq: Once | INTRAMUSCULAR | Status: AC
  Administered 2014-10-03: 50 ug via INTRAVENOUS
  Filled 2014-10-03: qty 2

## 2014-10-03 MED ORDER — HYDROCODONE-ACETAMINOPHEN 5-325 MG PO TABS: 1.0000 | ORAL_TABLET | ORAL | Status: DC | PRN

## 2014-10-03 MED ORDER — PROMETHAZINE HCL 25 MG/ML IJ SOLN
12.5000 mg | Freq: Once | INTRAMUSCULAR | Status: AC
  Administered 2014-10-03: 12.5 mg via INTRAVENOUS
  Filled 2014-10-03: qty 1

## 2014-10-03 MED ORDER — ONDANSETRON HCL 4 MG/2ML IJ SOLN
4.0000 mg | Freq: Once | INTRAMUSCULAR | Status: AC
  Administered 2014-10-03: 4 mg via INTRAVENOUS
  Filled 2014-10-03: qty 2

## 2014-10-03 MED ORDER — PROMETHAZINE HCL 25 MG PO TABS: 25.0000 mg | ORAL_TABLET | Freq: Four times a day (QID) | ORAL | Status: DC | PRN

## 2014-10-03 MED ORDER — METOCLOPRAMIDE HCL 5 MG/ML IJ SOLN
10.0000 mg | Freq: Once | INTRAMUSCULAR | Status: AC
  Administered 2014-10-03: 10 mg via INTRAVENOUS
  Filled 2014-10-03: qty 2

## 2014-10-03 MED ORDER — PANTOPRAZOLE SODIUM 40 MG IV SOLR
40.0000 mg | Freq: Once | INTRAVENOUS | Status: AC
  Administered 2014-10-03: 40 mg via INTRAVENOUS
  Filled 2014-10-03: qty 40

## 2014-10-03 NOTE — Discharge Instructions (Signed)
Medication for pain and nausea. Clear liquids. Follow-up your primary care doctor.

## 2014-10-03 NOTE — ED Provider Notes (Signed)
CSN: [id number]     Arrival date & time 10/03/14  1813 History   First MD Initiated Contact with Patient 10/03/14 1925     Chief Complaint  Patient presents with  . Chest Pain     (Consider location/radiation/quality/duration/timing/severity/associated sxs/prior Treatment) HPI.... Epigastric pain with radiation to left chest for 2 days. This is happened before. Patient has diabetes and gastroparesis. She has not been keeping fluids down. She had some streaks of blood in her emesis. No black stool. No dyspnea, diaphoresis, fever, diarrhea  Past Medical History  Diagnosis Date  . Chest pain syndrome   . Sinus tachycardia     Persistent  . Microcytic anemia   . DM (diabetes mellitus)   . HTN (hypertension)   . HLD (hyperlipidemia)   . Morbid obesity   . Anxiety   . Cervical cancer   . Diabetic gastroparesis     Diagnosed 2014.  Marland Kitchen Normocytic anemia   . Mesenteric adenitis    Past Surgical History  Procedure Laterality Date  . Hernia repair    . Partial hysterectomy    . Tubal ligation     Family History  Problem Relation Age of Onset  . Heart attack Father   . Heart attack Brother 36   History  Substance Use Topics  . Smoking status: Former Smoker -- 0.25 packs/day for 2 years    Types: Cigarettes  . Smokeless tobacco: Never Used  . Alcohol Use: Yes     Comment: rarely   OB History    Gravida Para Term Preterm AB TAB SAB Ectopic Multiple Living   3 3 3       3      Review of Systems  All other systems reviewed and are negative.     Allergies  Review of patient's allergies indicates no known allergies.  Home Medications   Prior to Admission medications   Medication Sig Start Date End Date Taking? Authorizing Provider  amLODipine (NORVASC) 10 MG tablet Take 10 mg by mouth daily.   Yes Historical Provider, MD  bismuth subsalicylate (PEPTO BISMOL) 262 MG chewable tablet Chew 524 mg by mouth as needed for indigestion or diarrhea or loose stools.    Yes  Historical Provider, MD  Insulin Glargine (LANTUS) 100 UNIT/ML Solostar Pen Inject 20 Units into the skin daily at 10 pm. 07/14/14  Yes Rexene Alberts, MD  metoprolol (LOPRESSOR) 100 MG tablet Take 100 mg by mouth 2 (two) times daily.   Yes Historical Provider, MD  pantoprazole (PROTONIX) 40 MG tablet Take 40 mg by mouth daily.   Yes Historical Provider, MD  sitaGLIPtin (JANUVIA) 100 MG tablet Take 100 mg by mouth daily.   Yes Historical Provider, MD  HYDROcodone-acetaminophen (NORCO/VICODIN) 5-325 MG per tablet Take 1-2 tablets by mouth every 4 (four) hours as needed. 10/03/14   Nat Christen, MD  promethazine (PHENERGAN) 25 MG tablet Take 1 tablet (25 mg total) by mouth every 6 (six) hours as needed. 10/03/14   Nat Christen, MD   BP 168/113 mmHg  Pulse 117  Temp(Src) 98.8 F (37.1 C) (Oral)  Resp 24  Ht 5\' 5"  (1.651 m)  Wt 318 lb (144.244 kg)  BMI 52.92 kg/m2  SpO2 100% Physical Exam  Constitutional: She is oriented to person, place, and time.  Morbidly obese  HENT:  Head: Normocephalic and atraumatic.  Eyes: Conjunctivae and EOM are normal. Pupils are equal, round, and reactive to light.  Neck: Normal range of motion. Neck supple.  Cardiovascular: Normal  rate and regular rhythm.   Pulmonary/Chest: Effort normal and breath sounds normal.  Abdominal: Soft. Bowel sounds are normal.  Musculoskeletal: Normal range of motion.  Neurological: She is alert and oriented to person, place, and time.  Skin: Skin is warm and dry.  Psychiatric: She has a normal mood and affect. Her behavior is normal.  Nursing note and vitals reviewed.   ED Course  Procedures (including critical care time) Labs Review Labs Reviewed  CBC WITH DIFFERENTIAL/PLATELET - Abnormal; Notable for the following:    WBC 14.0 (*)    Hemoglobin 9.1 (*)    HCT 32.0 (*)    MCV 77.9 (*)    MCH 22.1 (*)    MCHC 28.4 (*)    Platelets 404 (*)    Neutro Abs 9.7 (*)    All other components within normal limits  COMPREHENSIVE  METABOLIC PANEL - Abnormal; Notable for the following:    Glucose, Bld 187 (*)    All other components within normal limits  LIPASE, BLOOD - Abnormal; Notable for the following:    Lipase 18 (*)    All other components within normal limits  TROPONIN I    Imaging Review Dg Chest 2 View  10/03/2014   CLINICAL DATA:  Patient with epigastric pain and nausea. Central chest pain today.  EXAM: CHEST  2 VIEW  COMPARISON:  Chest radiograph 09/12/2014  FINDINGS: Stable cardiac and mediastinal contours, upper limits of normal. No consolidative pulmonary opacities. No pleural effusion or pneumothorax. Mid thoracic spine degenerative changes.  IMPRESSION: No acute cardiopulmonary process.   Electronically Signed   By: Lovey Newcomer M.D.   On: 10/03/2014 18:38     EKG Interpretation   Date/Time:  Tuesday October 03 2014 18:21:53 EDT Ventricular Rate:  115 PR Interval:  124 QRS Duration: 84 QT Interval:  342 QTC Calculation: 473 R Axis:   75 Text Interpretation:  Sinus tachycardia Otherwise normal ECG Confirmed by  Tausha Milhoan  MD, Keiri Solano ([id number]) on 10/03/2014 9:23:04 PM      MDM   Final diagnoses:  Epigastric pain    Patient feels much better after 2 L of IV fluid, IV Zofran, IV Phenergan, IV Dilaudid, IV Protonix. White count is minimally elevated.   Liver functions and lipase were normal. Leukocytosis elevated.    Nat Christen, MD 10/03/14 2200

## 2014-10-03 NOTE — ED Notes (Signed)
Patient verbalizes understanding of discharge instructions, prescription medications, home care and follow up care. Patient discharged to waiting area.

## 2014-10-03 NOTE — ED Notes (Signed)
Pt  C/o upper abd pain and vomiting since Sunday.  Today saw streaks of blood in emesis.  Says was approx 1 cup of [place] red blood in emesis.  Reports started having chest pain, sob, and diaphoresis today.

## 2015-06-19 ENCOUNTER — Encounter (HOSPITAL_COMMUNITY): Payer: Self-pay | Admitting: Emergency Medicine

## 2015-06-19 ENCOUNTER — Emergency Department (HOSPITAL_COMMUNITY): Payer: BLUE CROSS/BLUE SHIELD

## 2015-06-19 ENCOUNTER — Emergency Department (HOSPITAL_COMMUNITY)
Admission: EM | Admit: 2015-06-19 | Discharge: 2015-06-19 | Disposition: A | Discharge status: Home / Self Care | Payer: BLUE CROSS/BLUE SHIELD | Attending: Emergency Medicine | Admitting: Emergency Medicine

## 2015-06-19 DIAGNOSIS — Z794 Long term (current) use of insulin: Secondary | ICD-10-CM | POA: Insufficient documentation

## 2015-06-19 DIAGNOSIS — Z79899 Other long term (current) drug therapy: Secondary | ICD-10-CM | POA: Diagnosis not present

## 2015-06-19 DIAGNOSIS — I10 Essential (primary) hypertension: Secondary | ICD-10-CM | POA: Diagnosis not present

## 2015-06-19 DIAGNOSIS — R112 Nausea with vomiting, unspecified: Secondary | ICD-10-CM | POA: Diagnosis not present

## 2015-06-19 DIAGNOSIS — F17210 Nicotine dependence, cigarettes, uncomplicated: Secondary | ICD-10-CM | POA: Diagnosis not present

## 2015-06-19 DIAGNOSIS — D649 Anemia, unspecified: Secondary | ICD-10-CM | POA: Diagnosis not present

## 2015-06-19 DIAGNOSIS — E119 Type 2 diabetes mellitus without complications: Secondary | ICD-10-CM | POA: Insufficient documentation

## 2015-06-19 DIAGNOSIS — R1011 Right upper quadrant pain: Secondary | ICD-10-CM | POA: Insufficient documentation

## 2015-06-19 DIAGNOSIS — E785 Hyperlipidemia, unspecified: Secondary | ICD-10-CM | POA: Insufficient documentation

## 2015-06-19 DIAGNOSIS — R197 Diarrhea, unspecified: Secondary | ICD-10-CM | POA: Insufficient documentation

## 2015-06-19 DIAGNOSIS — Z8541 Personal history of malignant neoplasm of cervix uteri: Secondary | ICD-10-CM | POA: Diagnosis not present

## 2015-06-19 DIAGNOSIS — R1013 Epigastric pain: Secondary | ICD-10-CM | POA: Diagnosis present

## 2015-06-19 LAB — COMPREHENSIVE METABOLIC PANEL
ALBUMIN: 4.2 g/dL (ref 3.5–5.0)
ALT: 18 U/L (ref 14–54)
AST: 16 U/L (ref 15–41)
Alkaline Phosphatase: 101 U/L (ref 38–126)
Anion gap: 10 (ref 5–15)
BUN: 10 mg/dL (ref 6–20)
CHLORIDE: 104 mmol/L (ref 101–111)
CO2: 23 mmol/L (ref 22–32)
CREATININE: 0.62 mg/dL (ref 0.44–1.00)
Calcium: 9.3 mg/dL (ref 8.9–10.3)
GFR calc Af Amer: >60 mL/min (ref >60)
GLUCOSE: 187 mg/dL — AB (ref 65–99)
POTASSIUM: 3.7 mmol/L (ref 3.5–5.1)
SODIUM: 137 mmol/L (ref 135–145)
Total Bilirubin: 0.8 mg/dL (ref 0.3–1.2)
Total Protein: 8.1 g/dL (ref 6.5–8.1)

## 2015-06-19 LAB — CBC WITH DIFFERENTIAL/PLATELET
Basophils Absolute: 0.1 10*3/uL (ref 0.0–0.1)
Basophils Relative: 0 %
EOS PCT: 1 %
Eosinophils Absolute: 0.1 10*3/uL (ref 0.0–0.7)
HCT: 33.4 % — ABNORMAL LOW (ref 36.0–46.0)
Hemoglobin: 9.1 g/dL — ABNORMAL LOW (ref 12.0–15.0)
LYMPHS ABS: 2.6 10*3/uL (ref 0.7–4.0)
Lymphocytes Relative: 18 %
MCH: 20 pg — AB (ref 26.0–34.0)
MCHC: 27.2 g/dL — ABNORMAL LOW (ref 30.0–36.0)
MCV: 73.6 fL — AB (ref 78.0–100.0)
MONOS PCT: 4 %
Monocytes Absolute: 0.5 10*3/uL (ref 0.1–1.0)
Neutro Abs: 11 10*3/uL — ABNORMAL HIGH (ref 1.7–7.7)
Neutrophils Relative %: 77 %
PLATELETS: 533 10*3/uL — AB (ref 150–400)
RBC: 4.54 MIL/uL (ref 3.87–5.11)
RDW: 16.1 % — ABNORMAL HIGH (ref 11.5–15.5)
WBC: 14.3 10*3/uL — AB (ref 4.0–10.5)

## 2015-06-19 LAB — CBG MONITORING, ED: GLUCOSE-CAPILLARY: 181 mg/dL — AB (ref 65–99)

## 2015-06-19 LAB — TROPONIN I: Troponin I: <0.03 ng/mL (ref <0.031)

## 2015-06-19 LAB — LIPASE, BLOOD: Lipase: 23 U/L (ref 11–51)

## 2015-06-19 LAB — POC OCCULT BLOOD, ED: Fecal Occult Bld: POSITIVE — AB

## 2015-06-19 MED ORDER — SODIUM CHLORIDE 0.9 % IV BOLUS (SEPSIS): 1000.0000 mL | Freq: Once | INTRAVENOUS | Status: DC

## 2015-06-19 MED ORDER — SODIUM CHLORIDE 0.9 % IV BOLUS (SEPSIS)
1000.0000 mL | Freq: Once | INTRAVENOUS | Status: AC
  Administered 2015-06-19: 1000 mL via INTRAVENOUS

## 2015-06-19 MED ORDER — MORPHINE SULFATE (PF) 4 MG/ML IV SOLN
4.0000 mg | Freq: Once | INTRAVENOUS | Status: AC
  Administered 2015-06-19: 4 mg via INTRAVENOUS
  Filled 2015-06-19: qty 1

## 2015-06-19 MED ORDER — PANTOPRAZOLE SODIUM 40 MG IV SOLR
40.0000 mg | Freq: Once | INTRAVENOUS | Status: AC
  Administered 2015-06-19: 40 mg via INTRAVENOUS
  Filled 2015-06-19: qty 40

## 2015-06-19 MED ORDER — ONDANSETRON HCL 4 MG/2ML IJ SOLN
4.0000 mg | Freq: Once | INTRAMUSCULAR | Status: AC
  Administered 2015-06-19: 4 mg via INTRAVENOUS
  Filled 2015-06-19: qty 2

## 2015-06-19 MED ORDER — HYDROMORPHONE HCL 1 MG/ML IJ SOLN
1.0000 mg | Freq: Once | INTRAMUSCULAR | Status: AC
  Administered 2015-06-19: 1 mg via INTRAVENOUS
  Filled 2015-06-19: qty 1

## 2015-06-19 MED ORDER — METOCLOPRAMIDE HCL 5 MG/ML IJ SOLN
10.0000 mg | Freq: Once | INTRAMUSCULAR | Status: AC
  Administered 2015-06-19: 10 mg via INTRAVENOUS
  Filled 2015-06-19: qty 2

## 2015-06-19 MED ORDER — ONDANSETRON HCL 4 MG/2ML IJ SOLN
4.0000 mg | Freq: Once | INTRAMUSCULAR | Status: AC
Start: 2015-06-19 — End: 2015-06-19
  Administered 2015-06-19: 4 mg via INTRAVENOUS
  Filled 2015-06-19: qty 2

## 2015-06-19 MED ORDER — ONDANSETRON 8 MG PO TBDP: 8.0000 mg | ORAL_TABLET | Freq: Three times a day (TID) | ORAL | Status: AC | PRN

## 2015-06-19 NOTE — ED Notes (Signed)
Reports upper mid abd pain starting Friday, radiating into her chest. Seen at pcp yesterday dx with colitis and given rx for phenergan, cipro, and flagyl. Pt reports pain is worse today with N/V

## 2015-06-19 NOTE — ED Provider Notes (Signed)
CSN: [id number]     Arrival date & time 06/19/15  1418 History   First MD Initiated Contact with Patient 06/19/15 1553     Chief Complaint  Patient presents with  . Abdominal Pain    Patient is a 45 y.o. female presenting with abdominal pain. The history is provided by the patient.  Abdominal Pain Pain location:  Epigastric Pain quality: sharp   Pain radiation: shoulder, back and chest. Pain severity:  Severe Onset quality:  Gradual Duration:  5 days Timing:  Intermittent Chronicity:  New Relieved by:  Nothing Worsened by:  Palpation and movement Associated symptoms: chest pain, diarrhea, hematemesis, hematochezia and vomiting   Associated symptoms: no dysuria and no fever   Risk factors comment:  No travel patient reports 5 days ago she developed epigastric pain and vomiting It then progressed into diarrhea and the abd pain began radiating into shoulder/back and chest She now reports blood in stool and vomit She reports h/o diabetic gastroparesis but insists this "is different" Seen by PCP yesterday, given multiple meds without relief  Past Medical History  Diagnosis Date  . Chest pain syndrome   . Sinus tachycardia ([facility])     Persistent  . Microcytic anemia   . DM (diabetes mellitus) ([place])   . HTN (hypertension)   . HLD (hyperlipidemia)   . Morbid obesity ([place])   . Anxiety   . Cervical cancer ([place])   . Diabetic gastroparesis ([place])     Diagnosed 2014.  Marland Kitchen Normocytic anemia   . Mesenteric adenitis    Past Surgical History  Procedure Laterality Date  . Hernia repair    . Partial hysterectomy    . Tubal ligation     Family History  Problem Relation Age of Onset  . Heart attack Father   . Heart attack Brother 94   Social History  Substance Use Topics  . Smoking status: Former Smoker -- 0.25 packs/day for 2 years    Types: Cigarettes  . Smokeless tobacco: Never Used  . Alcohol Use: Yes     Comment: rarely   OB History    Gravida Para Term Preterm AB TAB SAB  Ectopic Multiple Living   3 3 3       3      Review of Systems  Constitutional: Negative for fever.  Cardiovascular: Positive for chest pain.  Gastrointestinal: Positive for vomiting, abdominal pain, diarrhea, hematochezia and hematemesis.  Genitourinary: Negative for dysuria.  All other systems reviewed and are negative.     Allergies  Review of patient's allergies indicates no known allergies.  Home Medications   Prior to Admission medications   Medication Sig Start Date End Date Taking? Authorizing Provider  bismuth subsalicylate (PEPTO BISMOL) 262 MG chewable tablet Chew 524 mg by mouth as needed for indigestion or diarrhea or loose stools.    Yes Historical Provider, MD  hydrochlorothiazide (HYDRODIURIL) 25 MG tablet Take 25 mg by mouth daily.   Yes Historical Provider, MD  Insulin Glargine (LANTUS) 100 UNIT/ML Solostar Pen Inject 20 Units into the skin daily at 10 pm. Patient taking differently: Inject 20 Units into the skin at bedtime as needed (for high blood sugar levels).  07/14/14  Yes Rexene Alberts, MD  metoprolol (LOPRESSOR) 100 MG tablet Take 100 mg by mouth 2 (two) times daily.   Yes Historical Provider, MD  ondansetron (ZOFRAN) 4 MG tablet Take 4 mg by mouth every 8 (eight) hours as needed for nausea or vomiting.   Yes Historical Provider,  MD  pantoprazole (PROTONIX) 40 MG tablet Take 40 mg by mouth daily.   Yes Historical Provider, MD  sitaGLIPtin (JANUVIA) 100 MG tablet Take 100 mg by mouth daily.   Yes Historical Provider, MD   BP 161/95 mmHg  Pulse 109  Temp(Src) 99.1 F (37.3 C) (Oral)  Resp 20  Ht 5\' 5"  (1.651 m)  Wt 141.976 kg  BMI 52.09 kg/m2  SpO2 99% Physical Exam CONSTITUTIONAL: Well developed/well nourished, uncomfortable appearing HEAD: Normocephalic/atraumatic EYES: EOMI/PERRL, no icterus ENMT: Mucous membranes dry NECK: supple no meningeal signs SPINE/BACK:entire spine nontender CV: S1/S2 noted, no murmurs/rubs/gallops noted LUNGS: Lungs are  clear to auscultation bilaterally, no apparent distress ABDOMEN: soft, moderate epigatric tenderness, no rebound or guarding, bowel sounds noted throughout abdomen GU:no cva tenderness NEURO: Pt is awake/alert/appropriate, moves all extremitiesx4.  No facial droop.   EXTREMITIES: pulses normal/equal, full ROM SKIN: warm, color normal PSYCH: anxious   ED Course  Procedures  Medications  sodium chloride 0.9 % bolus 1,000 mL (not administered)  ondansetron (ZOFRAN) injection 4 mg (4 mg Intravenous Given 06/19/15 1649)  sodium chloride 0.9 % bolus 1,000 mL (0 mLs Intravenous Stopped 06/19/15 1846)  morphine 4 MG/ML injection 4 mg (4 mg Intravenous Given 06/19/15 1649)  metoCLOPramide (REGLAN) injection 10 mg (10 mg Intravenous Given 06/19/15 1749)  morphine 4 MG/ML injection 4 mg (4 mg Intravenous Given 06/19/15 1749)  sodium chloride 0.9 % bolus 1,000 mL (0 mLs Intravenous Stopped 06/19/15 1951)  HYDROmorphone (DILAUDID) injection 1 mg (1 mg Intravenous Given 06/19/15 1854)  ondansetron (ZOFRAN) injection 4 mg (4 mg Intravenous Given 06/19/15 1854)  pantoprazole (PROTONIX) injection 40 mg (40 mg Intravenous Given 06/19/15 1854)    4:37 PM Will obtain STAT US/labs/ekg/hemoccult Will follow closely 6:38 PM Per nurse, stool was brown but hemoccult + Pt stable at this time 7:51 PM Pt improved No distress noted No focal ABD tenderness She is watching TV I doubt ACS as cause (pain originates in epigastric region) No signs of acute abdominal emergency She reports h/o similar but this seemed worse than usual On arrival to room,HR 100, but it quickly increases likely due to anxiety when I am in room  Labs Review Labs Reviewed  COMPREHENSIVE METABOLIC PANEL - Abnormal; Notable for the following:    Glucose, Bld 187 (*)    All other components within normal limits  CBC WITH DIFFERENTIAL/PLATELET - Abnormal; Notable for the following:    WBC 14.3 (*)    Hemoglobin 9.1 (*)    HCT 33.4 (*)    MCV 73.6  (*)    MCH 20.0 (*)    MCHC 27.2 (*)    RDW 16.1 (*)    Platelets 533 (*)    Neutro Abs 11.0 (*)    All other components within normal limits  CBG MONITORING, ED - Abnormal; Notable for the following:    Glucose-Capillary 181 (*)    All other components within normal limits  POC OCCULT BLOOD, ED - Abnormal; Notable for the following:    Fecal Occult Bld POSITIVE (*)    All other components within normal limits  LIPASE, BLOOD  TROPONIN I  CBG MONITORING, ED    Imaging Review US Abdomen Limited Ruq  06/19/2015  CLINICAL DATA:  RIGHT upper quadrant pain since Friday worse today, nausea and vomiting, pain radiating to chest, history diabetes mellitus, former smoker, cervical cancer EXAM: US ABDOMEN LIMITED - RIGHT UPPER QUADRANT COMPARISON:  None FINDINGS: Gallbladder: Normally distended without stones or wall thickening.  No pericholecystic fluid or sonographic Murphy sign. Common bile duct: Diameter: 4 mm diameter , normal Liver: Suboptimally visualized due to body habitus and sound attenuation. Echogenic, likely fatty infiltration, though this can be seen with cirrhosis and certain infiltrative disorders. No definite focal hepatic mass or nodularity identified though assessment of intrahepatic detail is significantly limited by sound attenuation and intrahepatic pathology is not excluded by this exam. Hepatopetal portal venous flow. No RIGHT upper quadrant free fluid. IMPRESSION: Probable fatty infiltration of liver as above. Suboptimal assessment of intrahepatic detail due to sound attenuation, unable to exclude intrahepatic pathology by this study. If better intrahepatic visualization is required, recommend CT or MR assessment. Electronically Signed   By: Lavonia Dana M.D.   On: 06/19/2015 17:52   I have personally reviewed and evaluated these  lab results as part of my medical decision-making.   EKG Interpretation   Date/Time:  Tuesday June 19 2015 17:54:17 EST Ventricular Rate:  110 PR  Interval:  129 QRS Duration: 88 QT Interval:  352 QTC Calculation: 476 R Axis:   68 Text Interpretation:  Sinus tachycardia Nonspecific repol abnormality,  lateral leads Baseline wander in lead(s) II aVR aVF V1 No significant  change since last tracing Confirmed by Christy Gentles  MD, Elenore Rota ([id number]) on  06/19/2015 6:01:40 PM      MDM   Final diagnoses:  RUQ pain  Non-intractable vomiting with nausea, vomiting of unspecified type  Diarrhea, unspecified type  Anemia, unspecified anemia type    Nursing notes including past medical history and social history reviewed and considered in documentation Labs/vital reviewed myself and considered during evaluation     Ripley Fraise, MD 06/19/15 1953

## 2015-06-19 NOTE — ED Notes (Signed)
In US at this time.

## 2015-06-19 NOTE — Discharge Instructions (Signed)
°  SEEK IMMEDIATE MEDICAL ATTENTION IF: °The pain does not go away or becomes severe, particularly over the next 8-12 hours.  °A temperature above 100.4F develops.  °Repeated vomiting occurs (multiple episodes).  °The pain becomes localized to portions of the abdomen. The right side could possibly be appendicitis. In an adult, the left lower portion of the abdomen could be colitis or diverticulitis.  °Blood is being passed in stools or vomit ([place] red or black tarry stools).  °Return also if you develop chest pain, difficulty breathing, dizziness or fainting, or become confused, poorly responsive, or inconsolable. ° °

## 2016-03-16 IMAGING — US US ABDOMEN LIMITED
1 series · 14 of 25 positions shown · non-contrast
Comparison: None

CLINICAL DATA: RIGHT upper quadrant pain since [REDACTED] worse today,
nausea and vomiting, pain radiating to chest, history diabetes
mellitus, former smoker, cervical cancer

EXAM:
US ABDOMEN LIMITED - RIGHT UPPER QUADRANT

[Series 1: us abdomen limited · 0.19mm/px · 14 of 56 slices shown]
[im 1/56]
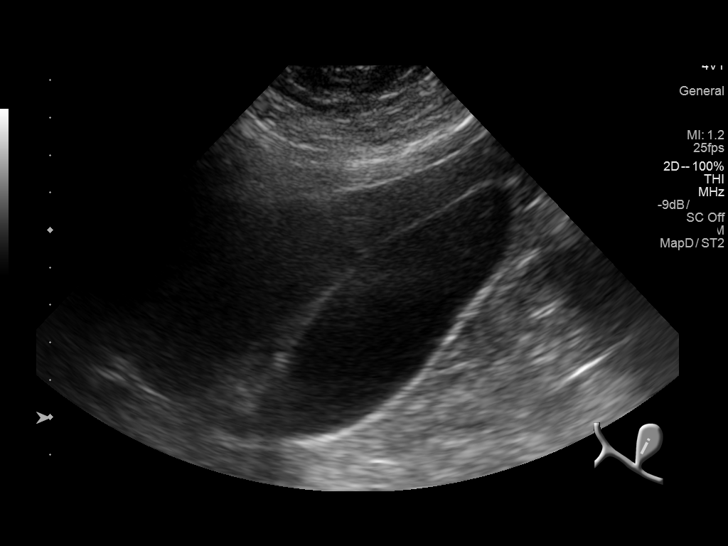
[im 5/56]
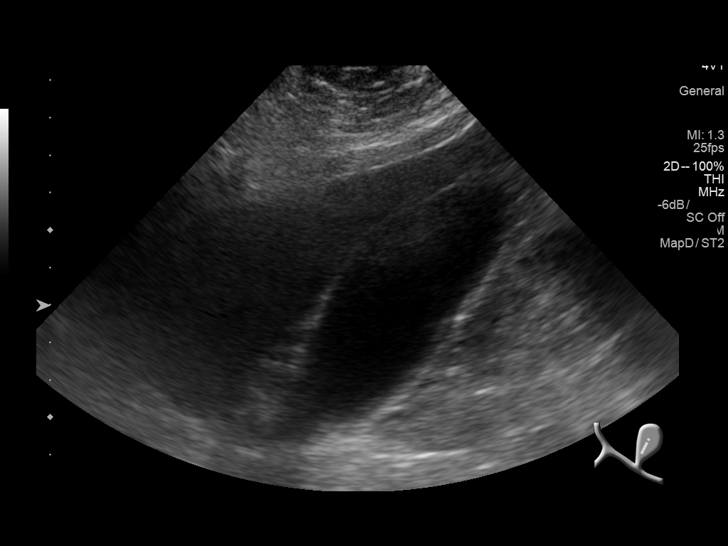
[im 10/56]
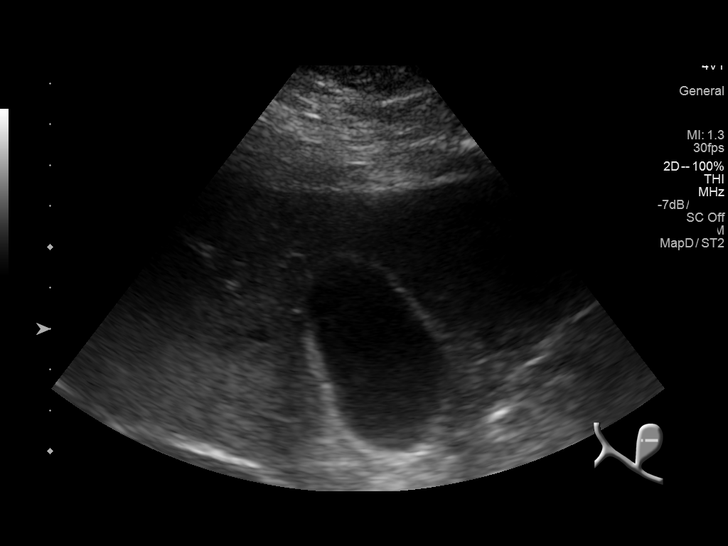
[im 14/56]
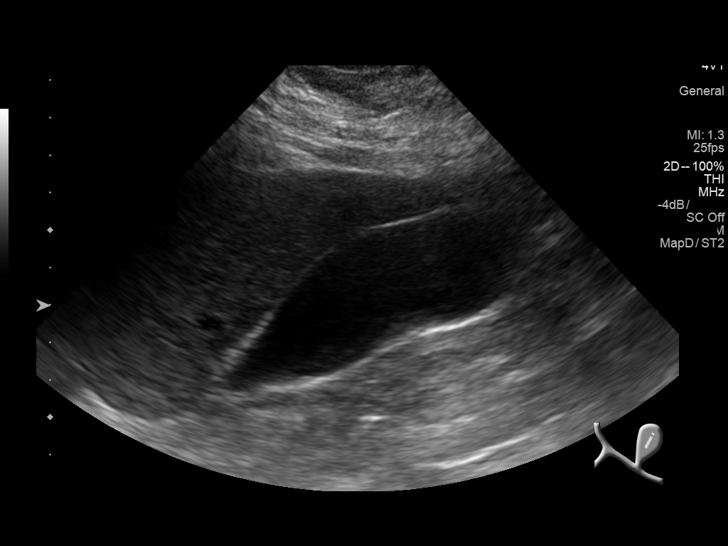
[im 19/56]
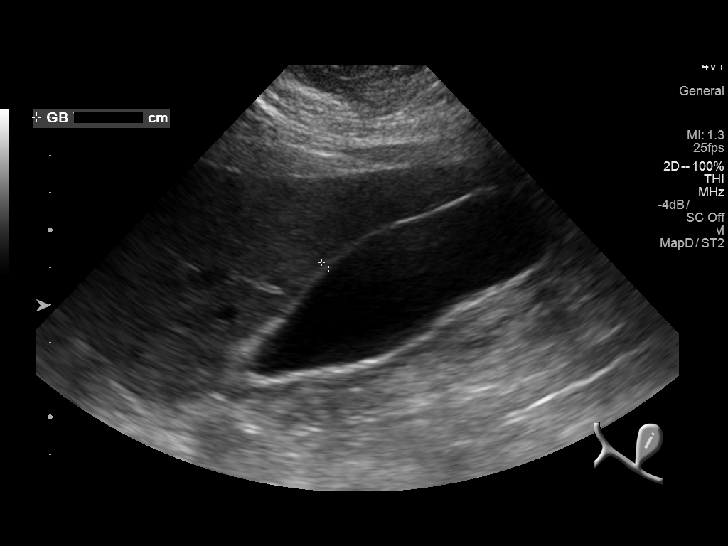
[im 21/56]
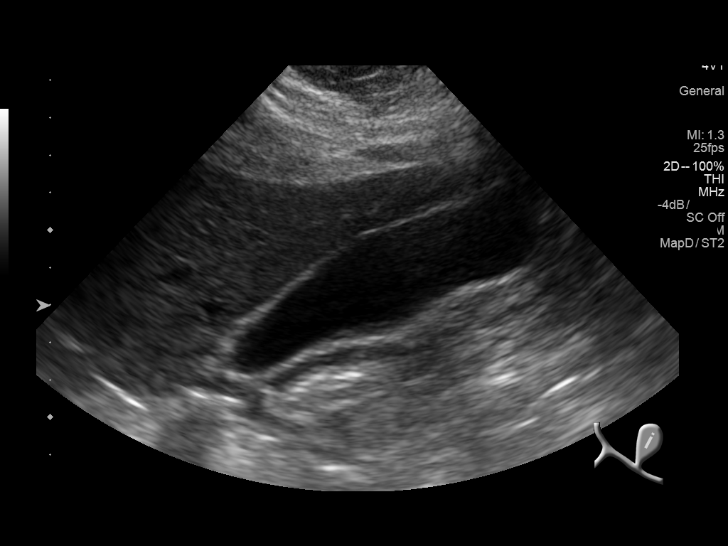
[im 26/56]
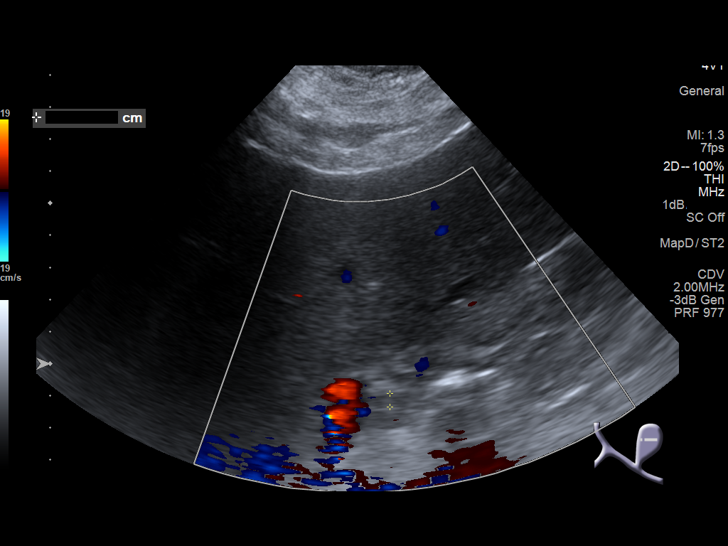
[im 30/56]
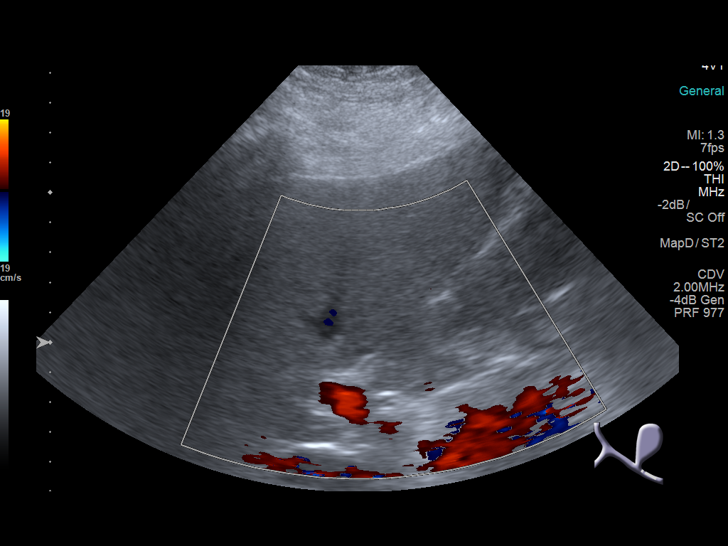
[im 35/56]
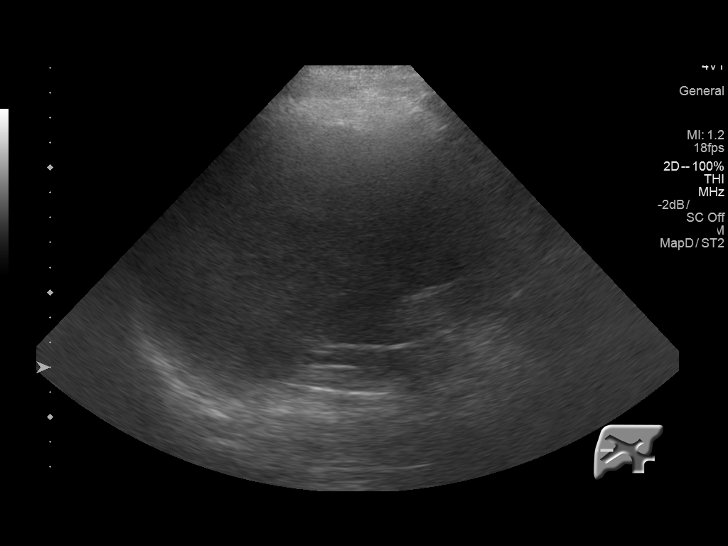
[im 37/56]
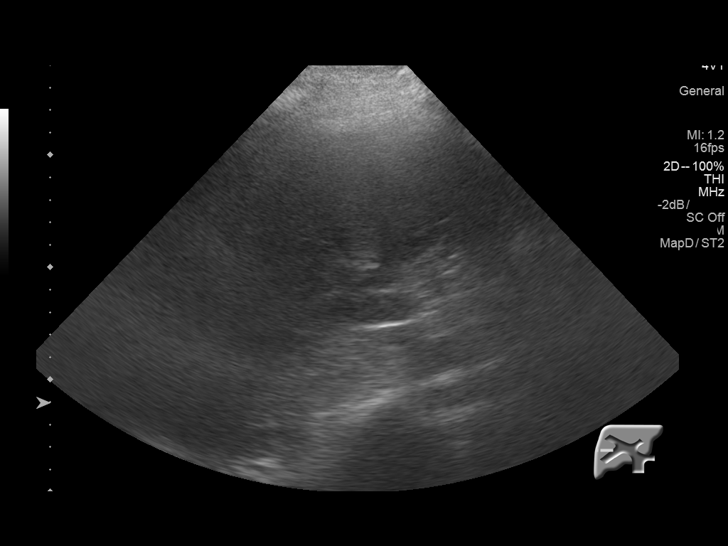
[im 42/56]
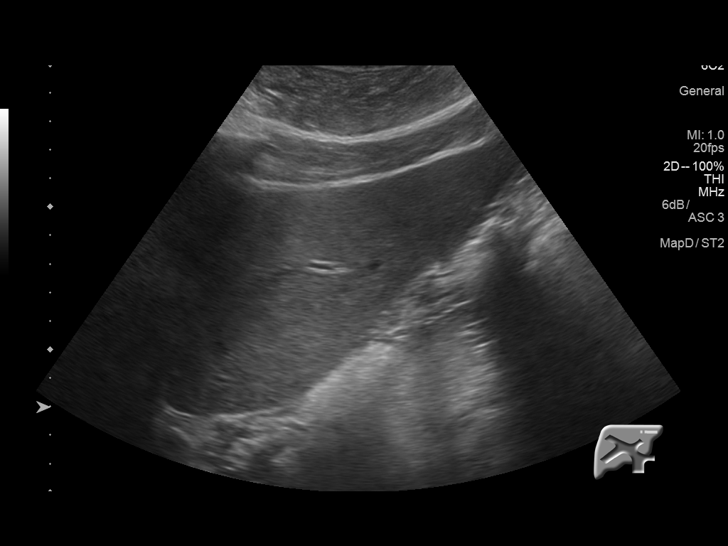
[im 46/56]
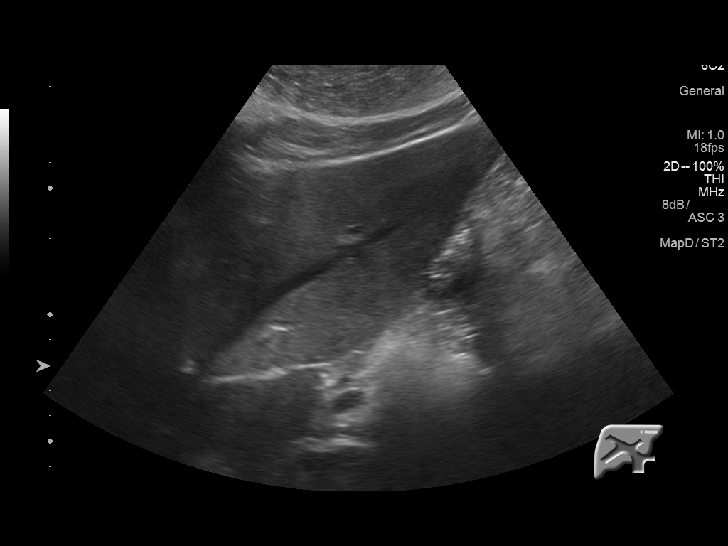
[im 51/56]
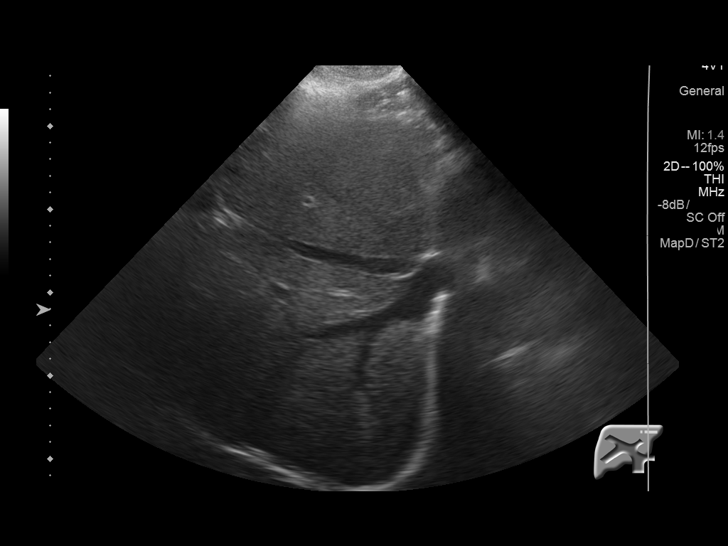
[im 56/56]
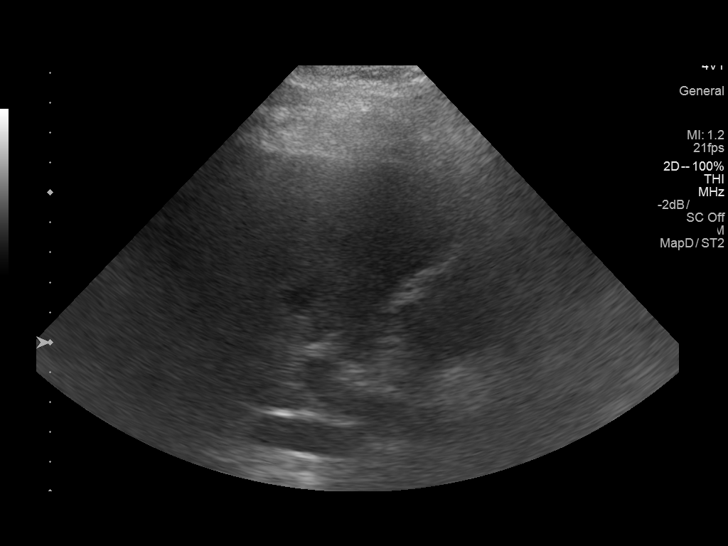

[14 of 25 positions shown; findings below may reference images not displayed]

FINDINGS: Gallbladder:

Normally distended without stones or wall thickening.

No pericholecystic fluid or sonographic Murphy sign.

Common bile duct:

Diameter: 4 mm diameter , normal

Liver:

Suboptimally visualized due to body habitus and sound attenuation.

Echogenic, likely fatty infiltration, though this can be seen with
cirrhosis and certain infiltrative disorders.

No definite focal hepatic mass or nodularity identified though
assessment of intrahepatic detail is significantly limited by sound
attenuation and intrahepatic pathology is not excluded by this exam.

Hepatopetal portal venous flow.

No RIGHT upper quadrant free fluid.
IMPRESSION: Probable fatty infiltration of liver as above.

Suboptimal assessment of intrahepatic detail due to sound
attenuation, unable to exclude intrahepatic pathology by this study.

If better intrahepatic visualization is required, recommend CT or MR
assessment.

## 2016-09-10 IMAGING — CT CT ABD-PELV W/ CM
3 of 10 series · 9 of 46 positions shown, 15 images · IV contrast (Omnipaque 300)
Comparison: Radiographs earlier this date. CT abdomen/ pelvis
02/21/2014

CLINICAL DATA: Upper abdominal pain radiating in chest and left
shoulder. Abdominal pain started 2 days prior, chest radiation 1 day
prior. Nausea, vomiting, and diarrhea.

EXAM:
CT ANGIOGRAPHY CHEST
CT ABDOMEN AND PELVIS WITH CONTRAST
TECHNIQUE: Multidetector CT imaging of the chest was performed using the
standard protocol during bolus administration of intravenous
contrast. Multiplanar CT image reconstructions and MIPs were
obtained to evaluate the vascular anatomy. Multidetector CT imaging
of the abdomen and pelvis was performed using the standard protocol
during bolus administration of intravenous contrast.
CONTRAST:  100mL OMNIPAQUE IOHEXOL 350 MG/ML SOLN

[Series 5: pe 3.0 b40f · axial · 0.73mm/px · z∈[-240,-87]mm · 4 of 86 slices shown]
[im 18/86  soft-tissue]
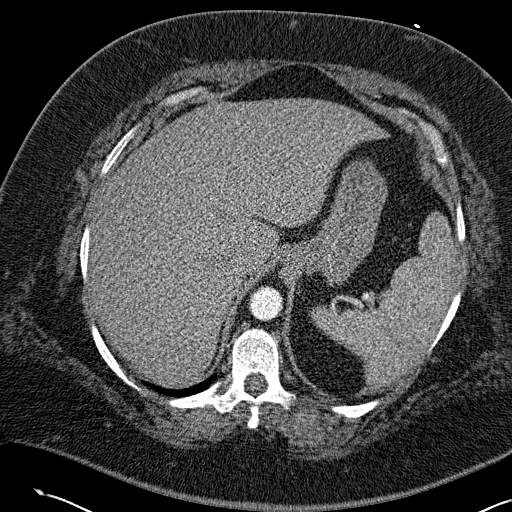
[im 35/86  soft-tissue]
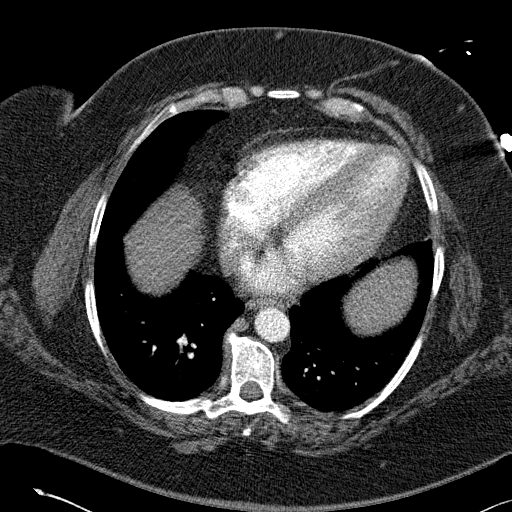
[im 52/86  soft-tissue]
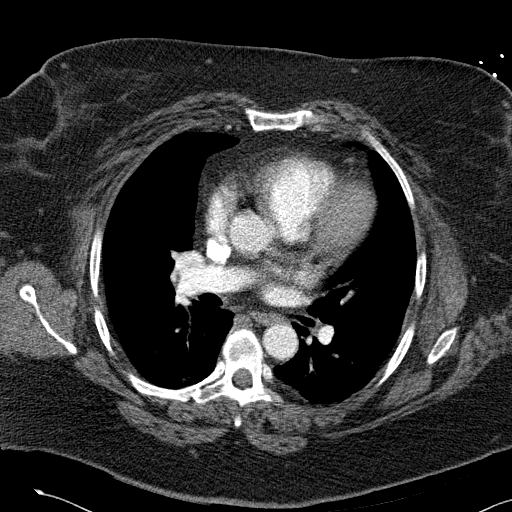
[im 69/86  soft-tissue]
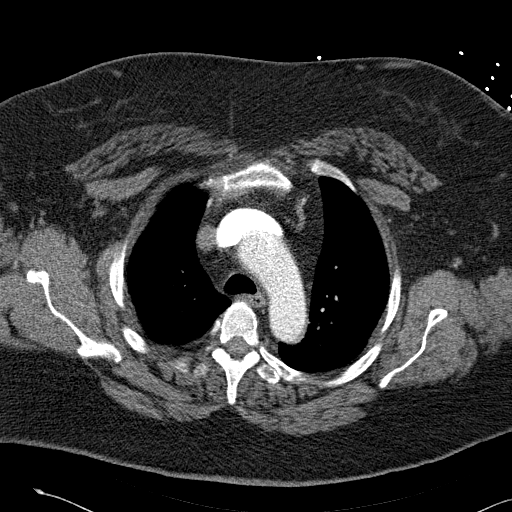

[Series 7: mpr coronal pe cor · coronal · 0.49mm/px · 1 of 101 slices shown, 2 images]
[im 51/101  soft-tissue]
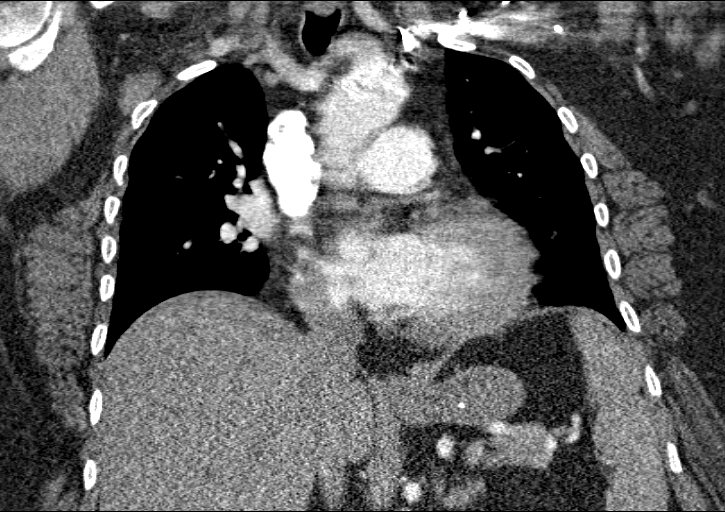
[im 51/101  bone]
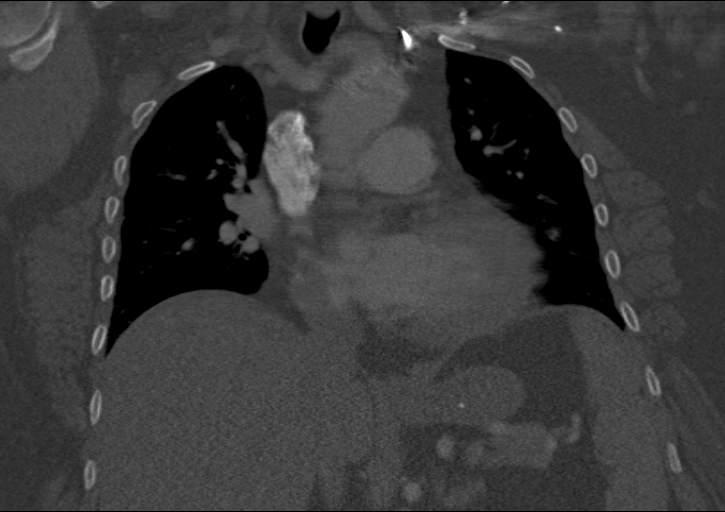

[Series 11: abd_pel 5.0 b40f · axial · 0.91mm/px · z∈[-557,-287]mm · 4 of 92 slices shown, 9 images]
[im 19/92  soft-tissue]
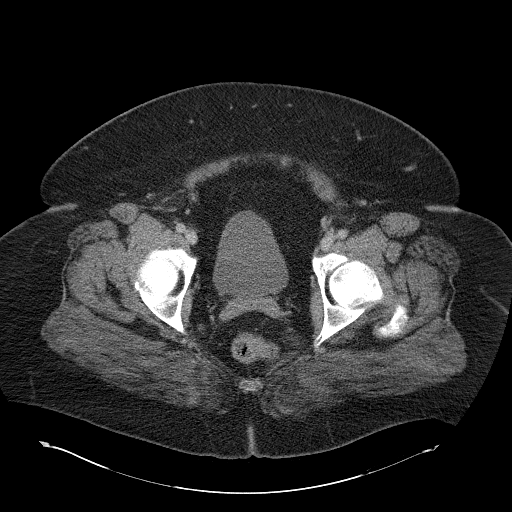
[im 19/92  lung]
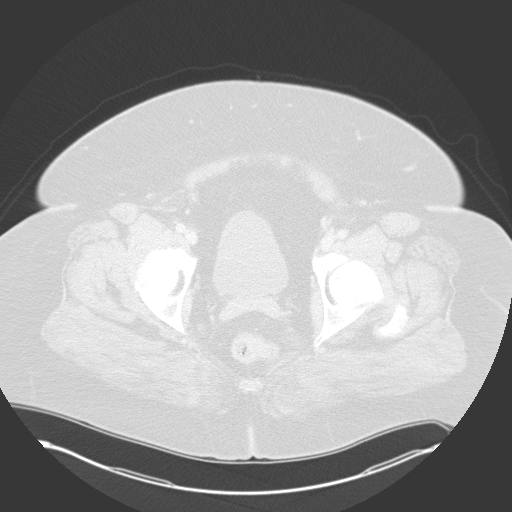
[im 19/92  bone]
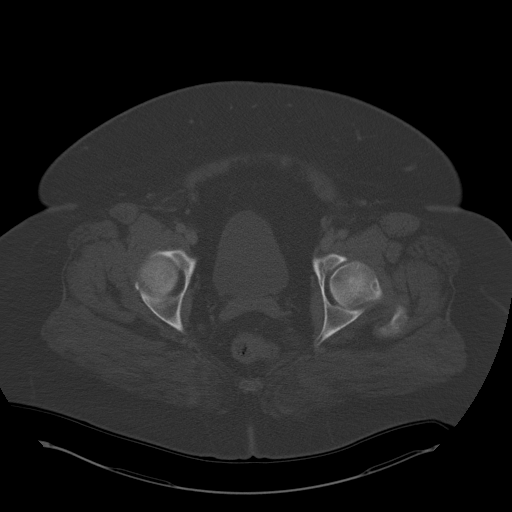
[im 37/92  soft-tissue]
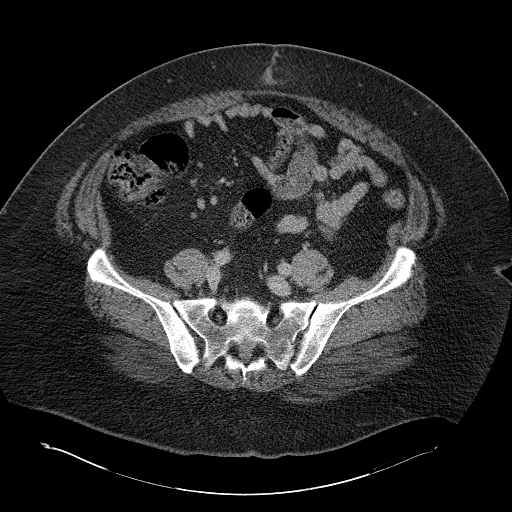
[im 37/92  lung]
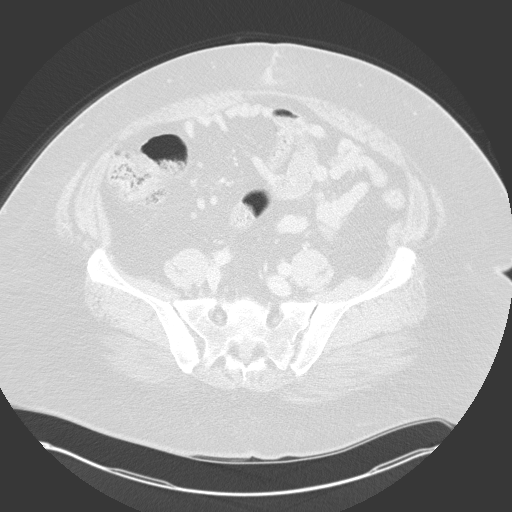
[im 55/92  soft-tissue]
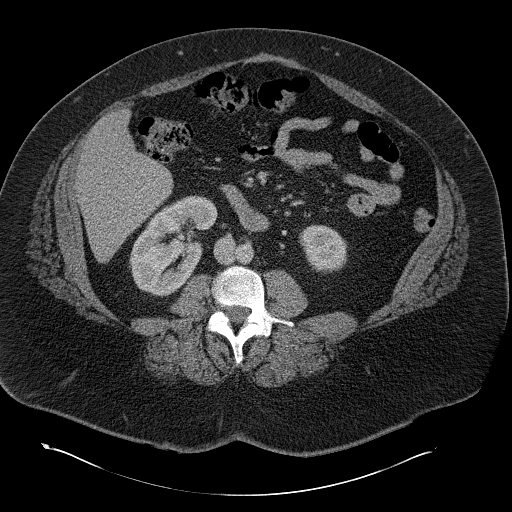
[im 55/92  lung]
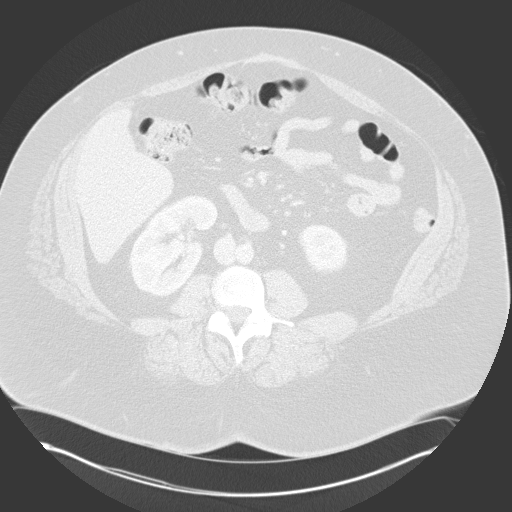
[im 73/92  soft-tissue]
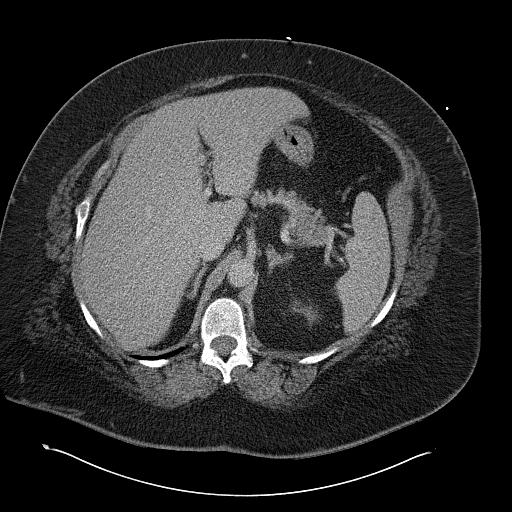
[im 73/92  lung]
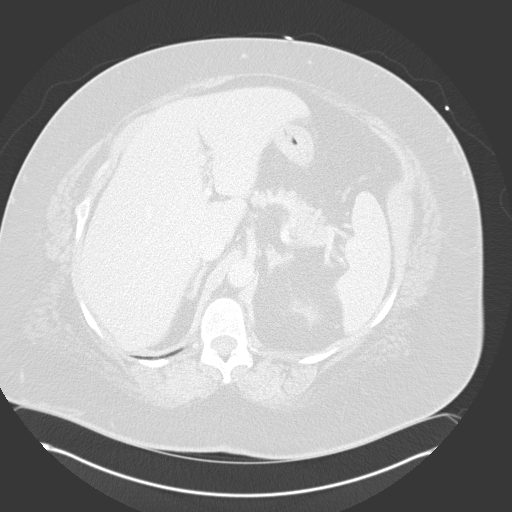

[9 of 46 positions shown; findings below may reference images not displayed]

FINDINGS: CTA CHEST FINDINGS

There are no filling defects within the pulmonary arteries to
suggest pulmonary embolus.

The thoracic aorta is normal in caliber without dissection. Heart at
the upper limits of normal in size. No pleural or pericardial
effusion. No mediastinal or hilar adenopathy. Mild hypoventilatory
changes at the lung bases, the lungs are otherwise clear. No
pulmonary nodule or mass. No consolidation.

There are no acute or suspicious osseous abnormalities.

CT ABDOMEN and PELVIS FINDINGS

Liver is enlarged measuring 22.4 cm in cranial caudal dimension with
mild hepatic steatosis. Gallbladder is physiologically distended. No
biliary dilatation. Spleen is at the upper limits of normal in size
measuring 12 cm. The pancreas and adrenal glands are normal. Kidneys
demonstrate symmetric enhancement and excretion. No hydronephrosis.
There is an 11 mm hypodensity in the upper left kidney.

Small hiatal hernia. Stomach is decompressed. There are no dilated
or thickened bowel loops. The appendix is normal. Small volume of
colonic stool. Borderline prominent mesenteric lymph nodes in the
ileocolic chain. No free air, free fluid, or intra-abdominal fluid
collection.

Abdominal aorta is normal in caliber. No retroperitoneal adenopathy.
Small fat containing umbilical hernia.

Within the pelvis the urinary bladder is minimally distended. Uterus
is surgically absent. Both ovaries are tentatively identified and
normal, no adnexal mass. There is no pelvic free fluid. No pelvic
adenopathy.

There are no acute or suspicious osseous abnormalities. Small bone
island in the right iliac bone, unchanged. Mild degenerative change
in the lumbar spine.

Review of the MIP images confirms the above findings.
IMPRESSION: 1. No pulmonary embolus or acute intrathoracic process.
2. Borderline prominent mesenteric lymph nodes in the ileocolic
chain, may reflect mild mesenteric adenitis.
3. There is otherwise no acute abnormality in the abdomen/pelvis.
Small left renal hypodensity, likely cyst, and fat containing
periumbilical hernia, unchanged.

## 2016-10-12 DEATH — deceased

## 2016-11-12 IMAGING — CR DG CHEST 1V PORT
1 series · 1 of 1 positions shown · non-contrast
Comparison: 07/11/2014 and prior exams

CLINICAL DATA: Left-sided chest pain for 2 days.

EXAM:
PORTABLE CHEST - 1 VIEW

[ap portable]
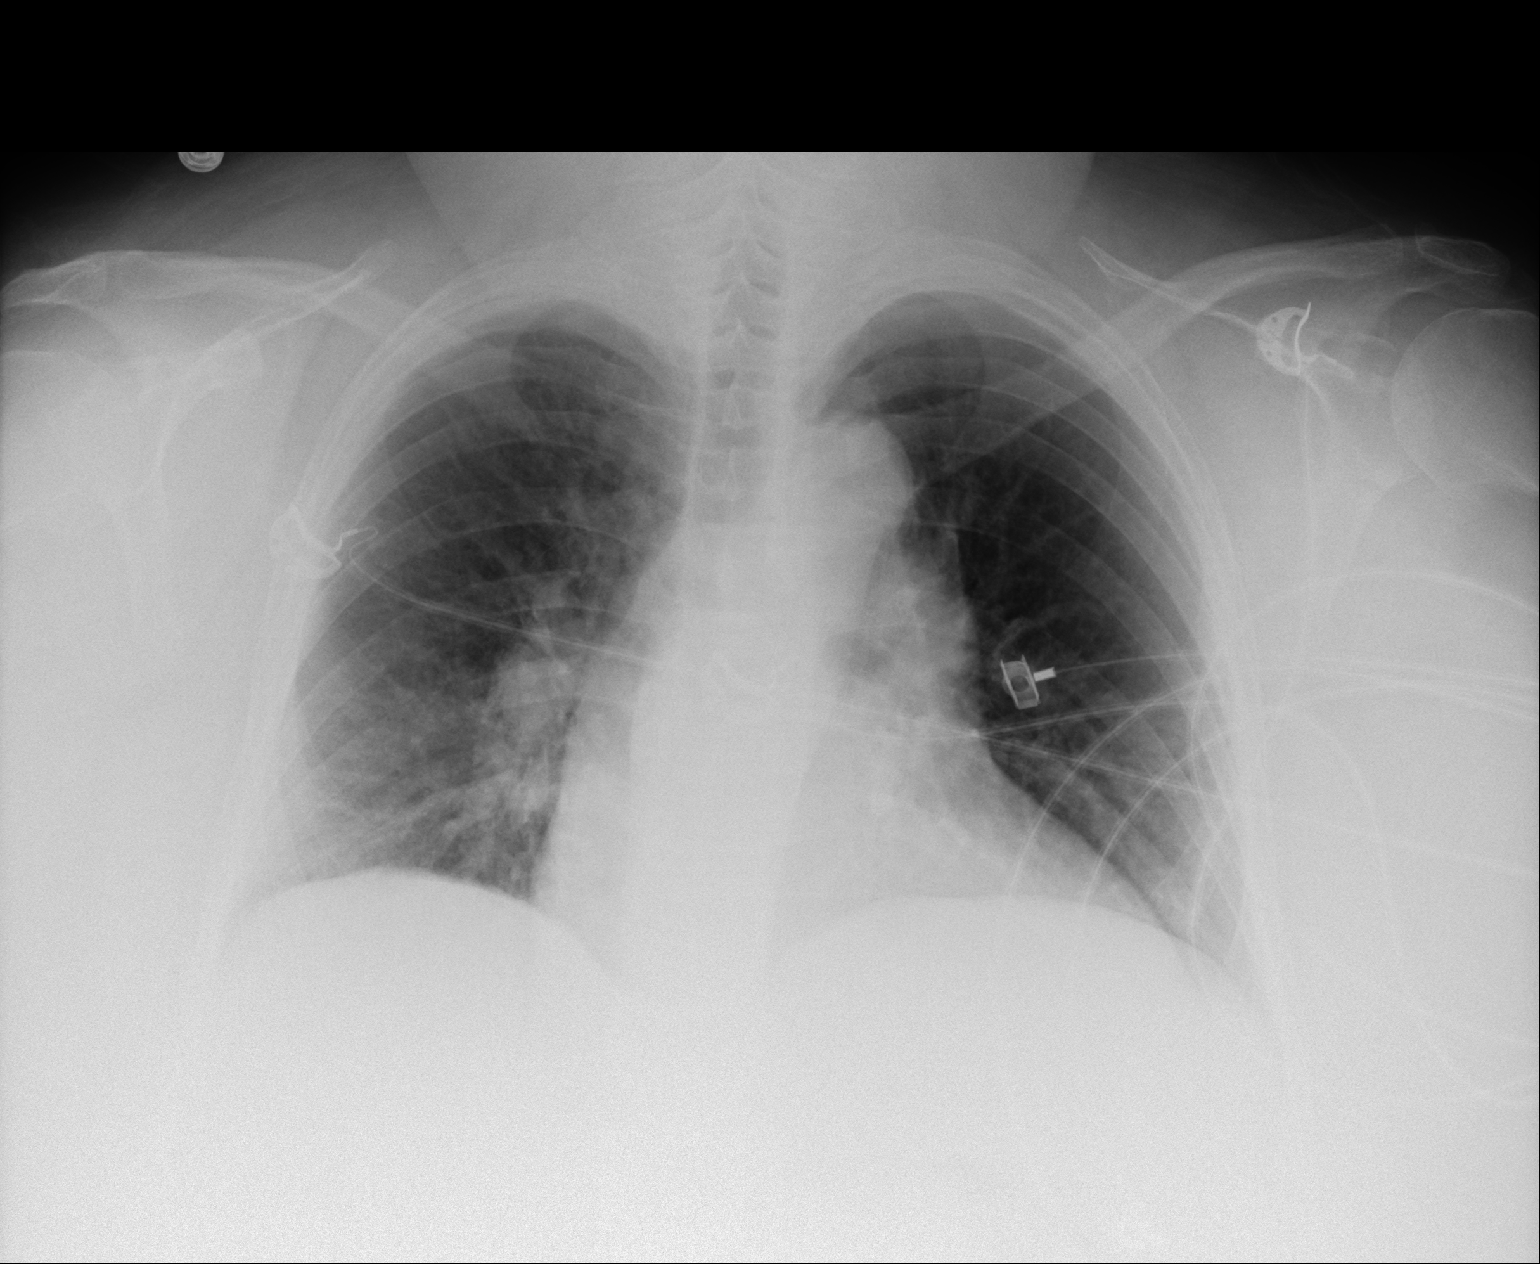

[1 of 1 positions shown; findings below may reference images not displayed]

FINDINGS: This is a low volume film.

The cardiomediastinal silhouette is unchanged.

There is no evidence of focal airspace disease, pulmonary edema,
suspicious pulmonary nodule/mass, pleural effusion, or pneumothorax.
No acute bony abnormalities are identified.
IMPRESSION: No active disease.

## 2016-12-03 IMAGING — DX DG CHEST 2V
2 series · 2 of 2 positions shown · non-contrast
Comparison: Chest radiograph 09/12/2014

CLINICAL DATA: Patient with epigastric pain and nausea. Central
chest pain today.

EXAM:
CHEST  2 VIEW

[chest pa]
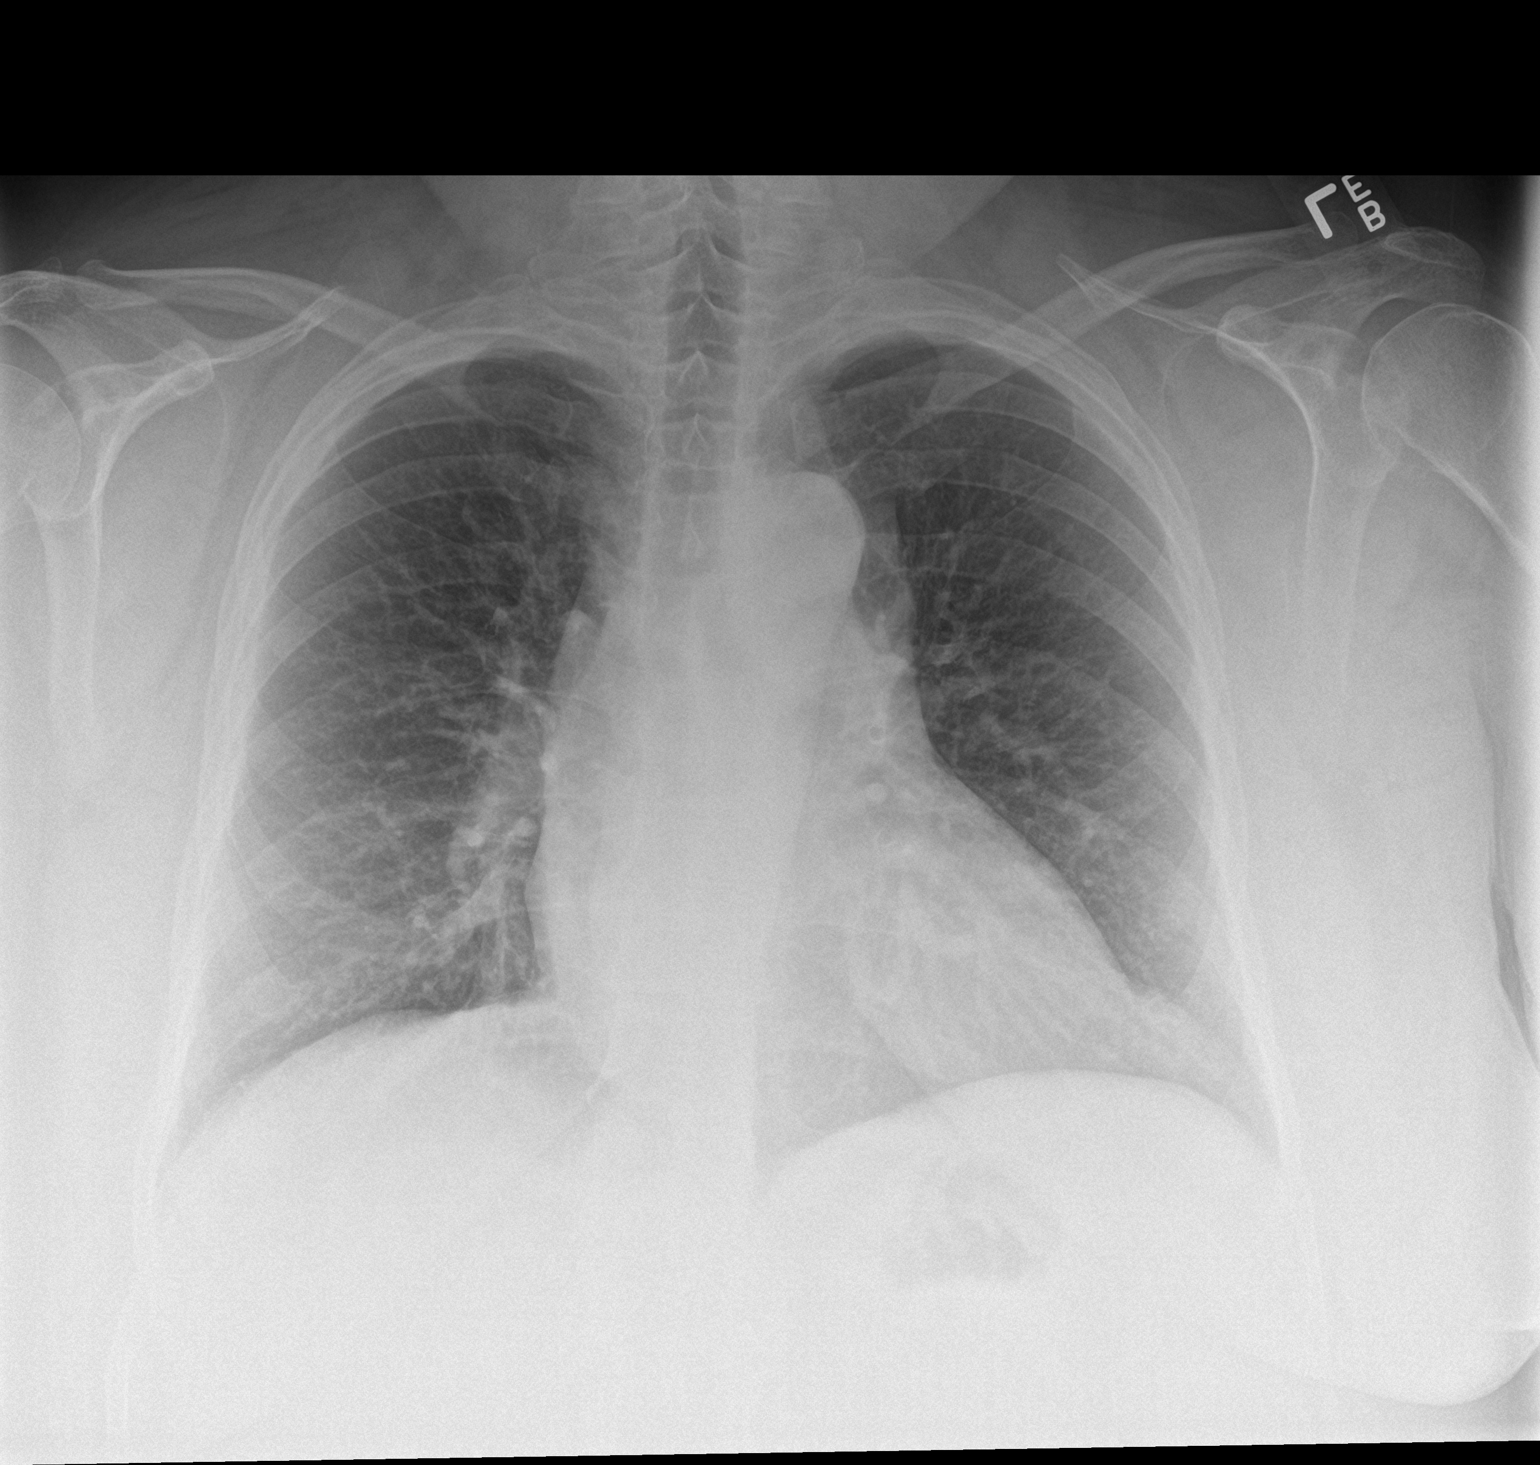

[chest lat]
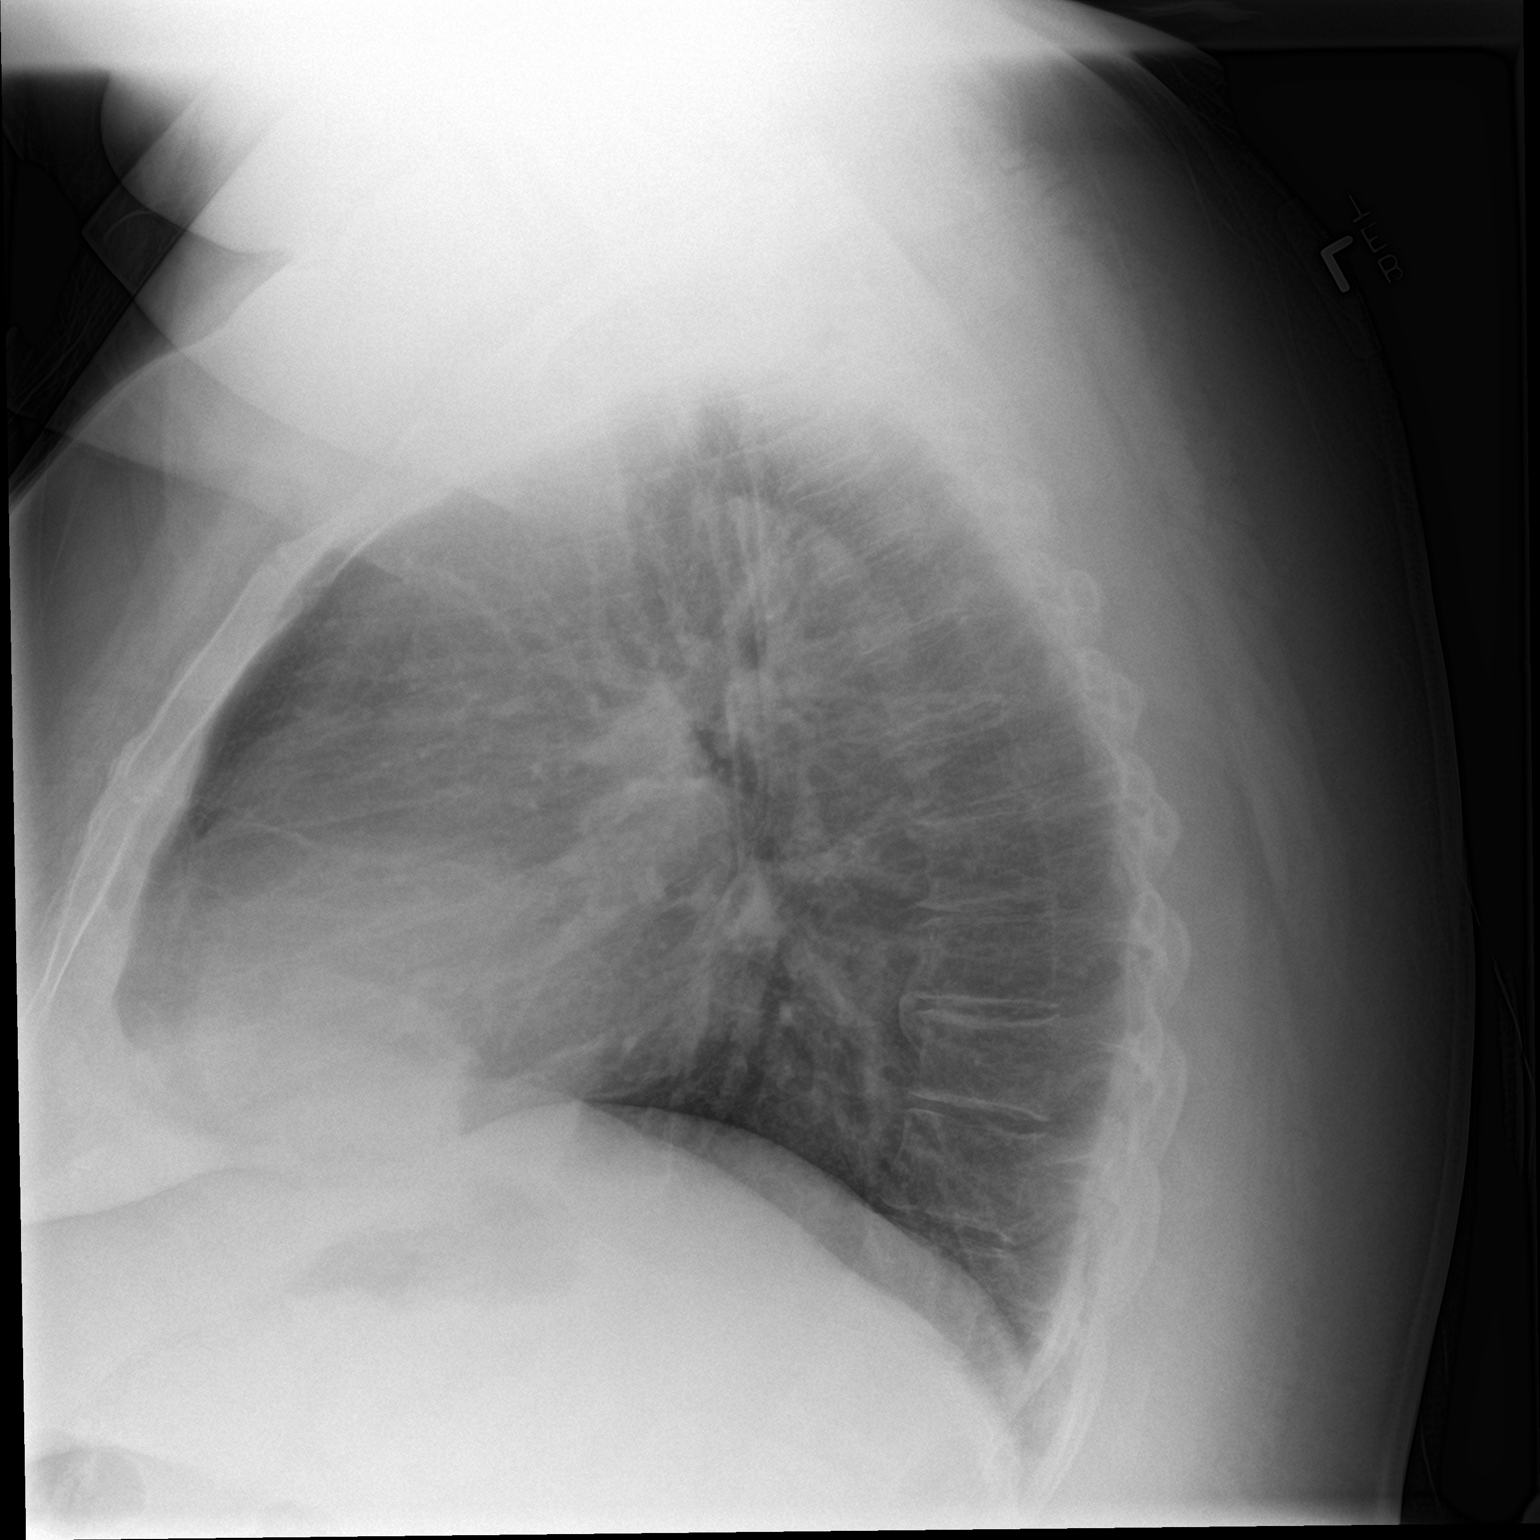

[2 of 2 positions shown; findings below may reference images not displayed]

FINDINGS: Stable cardiac and mediastinal contours, upper limits of normal. No
consolidative pulmonary opacities. No pleural effusion or
pneumothorax. Mid thoracic spine degenerative changes.
IMPRESSION: No acute cardiopulmonary process.
